# Patient Record
Sex: Female | Born: 1985 | Hispanic: Yes | Marital: Single | Smoking: Former smoker
Health system: Southern US, Community
[De-identification: ages and names within clinical notes are randomized; demographics above are authoritative.]

## PROBLEM LIST (undated history)

## (undated) DIAGNOSIS — F32A Depression, unspecified: Secondary | ICD-10-CM

## (undated) DIAGNOSIS — F329 Major depressive disorder, single episode, unspecified: Secondary | ICD-10-CM

## (undated) DIAGNOSIS — F209 Schizophrenia, unspecified: Secondary | ICD-10-CM

## (undated) DIAGNOSIS — F319 Bipolar disorder, unspecified: Secondary | ICD-10-CM

## (undated) DIAGNOSIS — R519 Headache, unspecified: Secondary | ICD-10-CM

## (undated) HISTORY — PX: NO PAST SURGERIES: SHX2092

---
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
## 2017-12-31 ENCOUNTER — Emergency Department (HOSPITAL_COMMUNITY)
Admission: EM | Admit: 2017-12-31 | Discharge: 2017-12-31 | Disposition: A | Discharge status: Home / Self Care | Payer: Medicaid Other | Attending: Emergency Medicine | Admitting: Emergency Medicine

## 2017-12-31 ENCOUNTER — Emergency Department (HOSPITAL_COMMUNITY): Payer: Medicaid Other

## 2017-12-31 ENCOUNTER — Other Ambulatory Visit: Payer: Self-pay

## 2017-12-31 DIAGNOSIS — R0600 Dyspnea, unspecified: Secondary | ICD-10-CM | POA: Insufficient documentation

## 2017-12-31 DIAGNOSIS — R0789 Other chest pain: Secondary | ICD-10-CM | POA: Diagnosis present

## 2017-12-31 DIAGNOSIS — R072 Precordial pain: Secondary | ICD-10-CM | POA: Insufficient documentation

## 2017-12-31 LAB — CBC WITH DIFFERENTIAL/PLATELET
Basophils Absolute: 0 10*3/uL (ref 0.0–0.1)
Basophils Relative: 0 %
Eosinophils Absolute: 0.3 10*3/uL (ref 0.0–0.7)
Eosinophils Relative: 4 %
HEMATOCRIT: 46.2 % — AB (ref 36.0–46.0)
Hemoglobin: 14.9 g/dL (ref 12.0–15.0)
LYMPHS ABS: 2.9 10*3/uL (ref 0.7–4.0)
LYMPHS PCT: 39 %
MCH: 30.3 pg (ref 26.0–34.0)
MCHC: 32.3 g/dL (ref 30.0–36.0)
MCV: 94.1 fL (ref 78.0–100.0)
Monocytes Absolute: 0.7 10*3/uL (ref 0.1–1.0)
Monocytes Relative: 10 %
Neutro Abs: 3.4 10*3/uL (ref 1.7–7.7)
Neutrophils Relative %: 47 %
PLATELETS: 369 10*3/uL (ref 150–400)
RBC: 4.91 MIL/uL (ref 3.87–5.11)
RDW: 12.9 % (ref 11.5–15.5)
WBC: 7.3 10*3/uL (ref 4.0–10.5)

## 2017-12-31 LAB — COMPREHENSIVE METABOLIC PANEL
ALBUMIN: 3.7 g/dL (ref 3.5–5.0)
ALT: 9 U/L (ref 0–44)
AST: 16 U/L (ref 15–41)
Alkaline Phosphatase: 57 U/L (ref 38–126)
Anion gap: 8 (ref 5–15)
BUN: 9 mg/dL (ref 6–20)
CHLORIDE: 107 mmol/L (ref 98–111)
CO2: 26 mmol/L (ref 22–32)
Calcium: 9 mg/dL (ref 8.9–10.3)
Creatinine, Ser: 0.55 mg/dL (ref 0.44–1.00)
GFR calc Af Amer: >60 mL/min (ref >60)
GLUCOSE: 101 mg/dL — AB (ref 70–99)
POTASSIUM: 3.9 mmol/L (ref 3.5–5.1)
SODIUM: 141 mmol/L (ref 135–145)
Total Bilirubin: 0.6 mg/dL (ref 0.3–1.2)
Total Protein: 6.5 g/dL (ref 6.5–8.1)

## 2017-12-31 LAB — URINALYSIS, ROUTINE W REFLEX MICROSCOPIC
Bacteria, UA: NONE SEEN
Bilirubin Urine: NEGATIVE
Glucose, UA: NEGATIVE mg/dL
Hgb urine dipstick: NEGATIVE
Ketones, ur: NEGATIVE mg/dL
Leukocytes, UA: NEGATIVE
Nitrite: NEGATIVE
PH: 6 (ref 5.0–8.0)
Protein, ur: NEGATIVE mg/dL
SPECIFIC GRAVITY, URINE: 1.017 (ref 1.005–1.030)

## 2017-12-31 LAB — I-STAT BETA HCG BLOOD, ED (MC, WL, AP ONLY)

## 2017-12-31 LAB — I-STAT TROPONIN, ED: Troponin i, poc: 0 ng/mL (ref 0.00–0.08)

## 2017-12-31 LAB — D-DIMER, QUANTITATIVE: D-Dimer, Quant: 0.29 ug/mL-FEU (ref 0.00–0.50)

## 2017-12-31 MED ORDER — KETOROLAC TROMETHAMINE 30 MG/ML IJ SOLN
30.0000 mg | Freq: Once | INTRAMUSCULAR | Status: AC
  Administered 2017-12-31: 30 mg via INTRAVENOUS
  Filled 2017-12-31: qty 1

## 2017-12-31 MED ORDER — ALBUTEROL SULFATE (2.5 MG/3ML) 0.083% IN NEBU
2.5000 mg | INHALATION_SOLUTION | Freq: Once | RESPIRATORY_TRACT | Status: AC
  Administered 2017-12-31: 2.5 mg via RESPIRATORY_TRACT
  Filled 2017-12-31: qty 3

## 2017-12-31 MED ORDER — IBUPROFEN 800 MG PO TABS: 800.0000 mg | ORAL_TABLET | Freq: Three times a day (TID) | ORAL | 0 refills | Status: DC | PRN

## 2017-12-31 MED ORDER — SODIUM CHLORIDE 0.9 % IV BOLUS
500.0000 mL | Freq: Once | INTRAVENOUS | Status: AC
  Administered 2017-12-31: 500 mL via INTRAVENOUS

## 2017-12-31 NOTE — Discharge Instructions (Signed)

## 2017-12-31 NOTE — ED Triage Notes (Signed)
She c/o shortness of breath today; plus chest pain, which she has had before. B.b.s. Clear per auscultation.

## 2017-12-31 NOTE — ED Provider Notes (Signed)
Emergency Department Provider Note   I have reviewed the triage vital signs and the nursing notes.   HISTORY  Chief Complaint Chest Pain   HPI Meagan Schmidt is a 32 y.o. female with PMH of intermittent chest pain presents to the emergency department for evaluation of central chest pain/tightness with shortness of breath.  Patient states that she awoke this morning with chest pain and that this episode feels similar to prior episodes of chest pain/shortness of breath that she has had in the past.  She states when she goes to the emergency department she typically gets IV fluids and a breathing treatment.  She does not have a history of asthma.  She states as of yet no cause for her symptoms has been identified.  She denies any leg swelling.  No radiation of symptoms.  Symptoms improved slightly with laying flat.  Denies any fevers, productive cough, hemoptysis.  No history of DVT/PE.  No past medical history on file.  There are no active problems to display for this patient.   Allergies Patient has no known allergies.  No family history on file.  Social History Social History   Tobacco Use  . Smoking status: Not on file  Substance Use Topics  . Alcohol use: Not on file  . Drug use: Not on file    Review of Systems  Constitutional: No fever/chills Eyes: No visual changes. ENT: No sore throat. Cardiovascular: Positive chest pain. Respiratory: Positive shortness of breath. Gastrointestinal: No abdominal pain.  No nausea, no vomiting.  No diarrhea.  No constipation. Genitourinary: Negative for dysuria. Musculoskeletal: Negative for back pain. Skin: Negative for rash. Neurological: Negative for headaches, focal weakness or numbness.  10-point ROS otherwise negative.  ____________________________________________   PHYSICAL EXAM:  VITAL SIGNS: ED Triage Vitals  Enc Vitals Group     BP 12/31/17 1050 (!) 116/91     Pulse Rate 12/31/17 1050 71       Resp 12/31/17 1050 20     Temp 12/31/17 1050 98.3 F (36.8 C)     Temp Source 12/31/17 1050 Oral     SpO2 12/31/17 1050 100 %     Pain Score 12/31/17 1052 7   Constitutional: Alert and oriented. Well appearing and in no acute distress. Eyes: Conjunctivae are normal. Head: Atraumatic. Nose: No congestion/rhinnorhea. Mouth/Throat: Mucous membranes are moist.  Oropharynx non-erythematous. Neck: No stridor.  Cardiovascular: Normal rate, regular rhythm. Good peripheral circulation. Grossly normal heart sounds.   Respiratory: Normal respiratory effort.  No retractions. Lungs CTAB. Gastrointestinal: Soft and nontender. No distention.  Musculoskeletal: No lower extremity tenderness nor edema. No gross deformities of extremities. Neurologic:  Normal speech and language. No gross focal neurologic deficits are appreciated.  Skin:  Skin is warm, dry and intact. No rash noted.  ____________________________________________   LABS (all labs ordered are listed, but only abnormal results are displayed)  Labs Reviewed  COMPREHENSIVE METABOLIC PANEL - Abnormal; Notable for the following components:      Result Value   Glucose, Bld 101 (*)    All other components within normal limits  CBC WITH DIFFERENTIAL/PLATELET - Abnormal; Notable for the following components:   HCT 46.2 (*)    All other components within normal limits  D-DIMER, QUANTITATIVE (NOT AT [facility])  URINALYSIS, ROUTINE W REFLEX MICROSCOPIC  I-STAT TROPONIN, ED  I-STAT BETA HCG BLOOD, ED ([facility], [facility], [facility] ONLY)   ____________________________________________  EKG   EKG Interpretation  Date/Time:  Saturday December 31 2017 11:04:47 EDT  Ventricular Rate:  57 PR Interval:    QRS Duration: 88 QT Interval:  452 QTC Calculation: 441 R Axis:   73 Text Interpretation:  Sinus rhythm No STEMI.  Confirmed by Alona BeneLong, Larence Thone [phone]([id number]) on 12/31/2017 11:13:21 AM Also confirmed by Alona BeneLong, Tameika Heckmann [phone]), editor Josephine IgoBelcher, Jessica ([phone])  on 12/31/2017  12:55:21 PM       ____________________________________________  RADIOLOGY  Dg Chest 2 View  Result Date: 12/31/2017 CLINICAL DATA:  Shortness of breath and chest pain EXAM: CHEST - 2 VIEW COMPARISON:  None. FINDINGS: Lungs are clear. Heart size and pulmonary vascularity are normal. No adenopathy. No pneumothorax. No bone lesions. IMPRESSION: No edema or consolidation. Electronically Signed   By: Bretta BangWilliam  Woodruff III M.D.   On: 12/31/2017 11:54    ____________________________________________   PROCEDURES  Procedure(s) performed:   Procedures  None ____________________________________________   INITIAL IMPRESSION / ASSESSMENT AND PLAN / ED COURSE  Pertinent labs & imaging results that were available during my care of the patient were reviewed by me and considered in my medical decision making (see chart for details).  Patient presents to the emergency department for evaluation of chest pain and shortness of breath which began after waking this morning.  She reports similar episodes in the past.  Vital signs are normal.  Her exam is largely unremarkable.  I do not appreciate any wheezing but she states breathing treatments have helped her in the past so we will give albuterol neb, IV fluids, and reassess after labs.  Will obtain d-dimer given history of chest pain shortness of breath which was sudden onset but patient is otherwise low risk.   CXR and labs reviewed. No acute findings. UA negative. Patient mentions some urinary frequency which prompted testing. CP resolved. Plan for close PCP follow up. [place] primarily lives in [place]. She is here for work. I provided contact for local PCP to establish care but [facility] if unable to establish care here.   At this time, I do not feel there is any life-threatening condition present. I have reviewed and discussed all results (EKG, imaging, lab, urine as appropriate), exam findings with patient. I have  reviewed nursing notes and appropriate previous records.  I feel the patient is safe to be discharged home without further emergent workup. Discussed usual and customary return precautions. Patient and family (if present) verbalize understanding and are comfortable with this plan.  Patient will follow-up with their primary care provider. If they do not have a primary care provider, information for follow-up has been provided to them. All questions have been answered.  ____________________________________________  FINAL CLINICAL IMPRESSION(S) / ED DIAGNOSES  Final diagnoses:  Precordial chest pain  Dyspnea, unspecified type     MEDICATIONS GIVEN DURING THIS VISIT:  Medications  sodium chloride 0.9 % bolus 500 mL (0 mLs Intravenous Stopped 12/31/17 1235)  ketorolac (TORADOL) 30 MG/ML injection 30 mg (30 mg Intravenous Given 12/31/17 1111)  albuterol (PROVENTIL) (2.5 MG/3ML) 0.083% nebulizer solution 2.5 mg (2.5 mg Nebulization Given 12/31/17 1111)     NEW OUTPATIENT MEDICATIONS STARTED DURING THIS VISIT:  Discharge Medication List as of 12/31/2017 12:34 PM    START taking these medications   Details  ibuprofen (ADVIL,MOTRIN) 800 MG tablet Take 1 tablet (800 mg total) by mouth every 8 (eight) hours as needed for moderate pain., Starting Sat 12/31/2017, Print        Note:  This document was prepared using Dragon voice recognition software and may  include unintentional dictation errors.  Alona Bene, MD Emergency Medicine    Sandeep Radell, Arlyss Repress, MD 12/31/17 [phone]

## 2018-10-30 ENCOUNTER — Emergency Department (HOSPITAL_COMMUNITY): Payer: Medicaid Other

## 2018-10-30 ENCOUNTER — Other Ambulatory Visit: Payer: Self-pay

## 2018-10-30 ENCOUNTER — Encounter (HOSPITAL_COMMUNITY): Payer: Self-pay | Admitting: Emergency Medicine

## 2018-10-30 ENCOUNTER — Emergency Department (HOSPITAL_COMMUNITY)
Admission: EM | Admit: 2018-10-30 | Discharge: 2018-10-30 | Disposition: A | Discharge status: Home / Self Care | Payer: Medicaid Other | Attending: Emergency Medicine | Admitting: Emergency Medicine

## 2018-10-30 DIAGNOSIS — Z3A01 Less than 8 weeks gestation of pregnancy: Secondary | ICD-10-CM | POA: Insufficient documentation

## 2018-10-30 DIAGNOSIS — O219 Vomiting of pregnancy, unspecified: Secondary | ICD-10-CM | POA: Insufficient documentation

## 2018-10-30 DIAGNOSIS — R1011 Right upper quadrant pain: Secondary | ICD-10-CM

## 2018-10-30 DIAGNOSIS — O99331 Smoking (tobacco) complicating pregnancy, first trimester: Secondary | ICD-10-CM | POA: Diagnosis not present

## 2018-10-30 DIAGNOSIS — O99611 Diseases of the digestive system complicating pregnancy, first trimester: Secondary | ICD-10-CM | POA: Diagnosis not present

## 2018-10-30 DIAGNOSIS — K209 Esophagitis, unspecified without bleeding: Secondary | ICD-10-CM

## 2018-10-30 DIAGNOSIS — R52 Pain, unspecified: Secondary | ICD-10-CM

## 2018-10-30 DIAGNOSIS — F17210 Nicotine dependence, cigarettes, uncomplicated: Secondary | ICD-10-CM | POA: Diagnosis not present

## 2018-10-30 DIAGNOSIS — K219 Gastro-esophageal reflux disease without esophagitis: Secondary | ICD-10-CM | POA: Diagnosis not present

## 2018-10-30 LAB — COMPREHENSIVE METABOLIC PANEL
ALT: 16 U/L (ref 0–44)
AST: 21 U/L (ref 15–41)
Albumin: 4.5 g/dL (ref 3.5–5.0)
Alkaline Phosphatase: 55 U/L (ref 38–126)
Anion gap: 11 (ref 5–15)
BUN: 13 mg/dL (ref 6–20)
CO2: 21 mmol/L — ABNORMAL LOW (ref 22–32)
Calcium: 9 mg/dL (ref 8.9–10.3)
Chloride: 103 mmol/L (ref 98–111)
Creatinine, Ser: 0.66 mg/dL (ref 0.44–1.00)
GFR calc Af Amer: >60 mL/min (ref >60)
GFR calc non Af Amer: >60 mL/min (ref >60)
Glucose, Bld: 93 mg/dL (ref 70–99)
Potassium: 3.3 mmol/L — ABNORMAL LOW (ref 3.5–5.1)
Sodium: 135 mmol/L (ref 135–145)
Total Bilirubin: 1.1 mg/dL (ref 0.3–1.2)
Total Protein: 8.1 g/dL (ref 6.5–8.1)

## 2018-10-30 LAB — URINALYSIS, ROUTINE W REFLEX MICROSCOPIC
Bacteria, UA: NONE SEEN
Bilirubin Urine: NEGATIVE
Glucose, UA: NEGATIVE mg/dL
Hgb urine dipstick: NEGATIVE
Ketones, ur: 80 mg/dL — AB
Leukocytes,Ua: NEGATIVE
Nitrite: NEGATIVE
Protein, ur: 30 mg/dL — AB
Specific Gravity, Urine: 1.04 — ABNORMAL HIGH (ref 1.005–1.030)
pH: 5 (ref 5.0–8.0)

## 2018-10-30 LAB — CBC
HCT: 49.8 % — ABNORMAL HIGH (ref 36.0–46.0)
Hemoglobin: 16.3 g/dL — ABNORMAL HIGH (ref 12.0–15.0)
MCH: 30.6 pg (ref 26.0–34.0)
MCHC: 32.7 g/dL (ref 30.0–36.0)
MCV: 93.4 fL (ref 80.0–100.0)
Platelets: 350 10*3/uL (ref 150–400)
RBC: 5.33 MIL/uL — ABNORMAL HIGH (ref 3.87–5.11)
RDW: 13.1 % (ref 11.5–15.5)
WBC: 11 10*3/uL — ABNORMAL HIGH (ref 4.0–10.5)
nRBC: 0 % (ref 0.0–0.2)

## 2018-10-30 LAB — I-STAT BETA HCG BLOOD, ED (MC, WL, AP ONLY): I-stat hCG, quantitative: >2000 m[IU]/mL — ABNORMAL HIGH (ref <5)

## 2018-10-30 LAB — HCG, QUANTITATIVE, PREGNANCY: hCG, Beta Chain, Quant, S: 10422 m[IU]/mL — ABNORMAL HIGH (ref <5)

## 2018-10-30 LAB — TROPONIN I: Troponin I: <0.03 ng/mL (ref <0.03)

## 2018-10-30 LAB — LIPASE, BLOOD: Lipase: 19 U/L (ref 11–51)

## 2018-10-30 MED ORDER — POTASSIUM CHLORIDE CRYS ER 20 MEQ PO TBCR
40.0000 meq | EXTENDED_RELEASE_TABLET | Freq: Once | ORAL | Status: AC
  Administered 2018-10-30: 40 meq via ORAL
  Filled 2018-10-30: qty 2

## 2018-10-30 MED ORDER — DOXYLAMINE-PYRIDOXINE 10-10 MG PO TBEC: 1.0000 | DELAYED_RELEASE_TABLET | Freq: Three times a day (TID) | ORAL | 0 refills | Status: DC | PRN

## 2018-10-30 MED ORDER — FAMOTIDINE 20 MG PO TABS: 20.0000 mg | ORAL_TABLET | Freq: Every day | ORAL | 0 refills | Status: DC

## 2018-10-30 MED ORDER — SODIUM CHLORIDE 0.9 % IV BOLUS
1000.0000 mL | Freq: Once | INTRAVENOUS | Status: AC
  Administered 2018-10-30: 14:00:00 1000 mL via INTRAVENOUS

## 2018-10-30 MED ORDER — ALUM & MAG HYDROXIDE-SIMETH 200-200-20 MG/5ML PO SUSP
30.0000 mL | Freq: Once | ORAL | Status: AC
  Administered 2018-10-30: 30 mL via ORAL
  Filled 2018-10-30: qty 30

## 2018-10-30 MED ORDER — METOCLOPRAMIDE HCL 5 MG/ML IJ SOLN
10.0000 mg | Freq: Once | INTRAMUSCULAR | Status: AC
  Administered 2018-10-30: 10 mg via INTRAVENOUS
  Filled 2018-10-30: qty 2

## 2018-10-30 MED ORDER — CALCIUM CARBONATE ANTACID 500 MG PO CHEW: 1.0000 | CHEWABLE_TABLET | Freq: Three times a day (TID) | ORAL | 0 refills | Status: DC | PRN

## 2018-10-30 MED ORDER — DIPHENHYDRAMINE HCL 50 MG/ML IJ SOLN
25.0000 mg | Freq: Once | INTRAMUSCULAR | Status: AC
  Administered 2018-10-30: 25 mg via INTRAVENOUS
  Filled 2018-10-30: qty 1

## 2018-10-30 NOTE — ED Provider Notes (Signed)
Patient care assumed at 1500. Patient here for evaluation of nausea, vomiting, chest discomfort consistent with reflux. She was found to be pregnant. Pelvic ultrasound demonstrates early pregnancy. Patient without recurrent vomiting in department. UA is not consistent with UTI. Plan to discharge with outpatient follow-up and return precautions.   Tilden Fossa, MD 10/30/18 [phone]

## 2018-10-30 NOTE — ED Triage Notes (Signed)
Pt c/o left sided chest pains since Thursday. Reports pain was so bad it wouldn't let her eat or drink. Reports went to [employer] on Thursday to get a tea to make chest pains better. Reports that she had a smoothie the other day and it didn't make her vomit. Pt reports had some diarrhea today. Also reports had two home pregnancy tests that are positive.

## 2018-10-30 NOTE — ED Triage Notes (Signed)
Pt adds that she been smoking cigarettes since age 33 and did smoke a lot the night before chest pains started.

## 2018-10-30 NOTE — ED Provider Notes (Signed)
[facility]-EMERGENCY DEPT Provider Note   CSN: [id number] Arrival date & time: 10/30/18  1235    History   Chief Complaint Chief Complaint  Patient presents with   Chest Pain   Diarrhea   Possible Pregnancy    HPI Meagan Schmidt is a 33 y.o. female.     HPI 33 year old female with past medical history of bicornuate uterus here with multiple complaints.  The patient states that she has had severe, burning, epigastric and substernal chest pain for the last several days.  This seems to be associated with nausea and vomiting, and she has temporary relief after vomiting.  She describes as burning sensation.  Is worse with lying flat.  She also had some mild pain in her right upper quadrant that radiates around towards her right back.  Denies any urinary symptoms.  Of note, she also had some vaginal spotting a week ago.  This was only 1 day of spotting and she took a pregnancy test which was positive.  She has repeated this and a second test was also positive at home.  She denies any fevers or chills.  She denies any vaginal bleeding or discharge.  She has 1 prior pregnancy that was delivered at 7 months due to her bicornate uterus and intrauterine growth restriction.  Denies history of miscarriages.  No other complaints.  History reviewed. No pertinent past medical history.  There are no active problems to display for this patient.   History reviewed. No pertinent surgical history.   OB History    Gravida  1   Para      Term      Preterm      AB      Living        SAB      TAB      Ectopic      Multiple      Live Births               Home Medications    Prior to Admission medications   Medication Sig Start Date End Date Taking? Authorizing Provider  calcium carbonate (TUMS) 500 MG chewable tablet Chew 1 tablet (200 mg of elemental calcium total) by mouth 3 (three) times daily as needed for indigestion or heartburn.  10/30/18   Shaune Pollack, MD  Doxylamine-Pyridoxine 10-10 MG TBEC Take 1 tablet by mouth 3 (three) times daily as needed (nausea, vomiting). 10/30/18   Shaune Pollack, MD  famotidine (PEPCID) 20 MG tablet Take 1 tablet (20 mg total) by mouth daily for 7 days. 10/30/18 11/06/18  Shaune Pollack, MD  ibuprofen (ADVIL,MOTRIN) 800 MG tablet Take 1 tablet (800 mg total) by mouth every 8 (eight) hours as needed for moderate pain. Patient not taking: Reported on 10/30/2018 12/31/17   Long, Arlyss Repress, MD    Family History No family history on file.  Social History Social History   Tobacco Use   Smoking status: Current Every Day Smoker    Types: Cigarettes   Smokeless tobacco: Never Used  Substance Use Topics   Alcohol use: Not on file   Drug use: Not on file     Allergies   Patient has no known allergies.   Review of Systems Review of Systems  Constitutional: Positive for fatigue. Negative for chills and fever.  HENT: Negative for congestion, rhinorrhea and sore throat.   Eyes: Negative for visual disturbance.  Respiratory: Negative for cough, shortness of breath and wheezing.   Cardiovascular:  Negative for chest pain and leg swelling.  Gastrointestinal: Positive for abdominal pain, nausea and vomiting. Negative for diarrhea.  Genitourinary: Negative for dysuria, flank pain, vaginal bleeding and vaginal discharge.  Musculoskeletal: Negative for neck pain.  Skin: Negative for rash.  Allergic/Immunologic: Negative for immunocompromised state.  Neurological: Negative for syncope and headaches.  Hematological: Does not bruise/bleed easily.  All other systems reviewed and are negative.    Physical Exam Updated Vital Signs BP 106/64 (BP Location: Right Arm)    Pulse 77    Temp 98.5 F (36.9 C) (Oral)    Resp 16    Ht 5\' 1"  (1.549 m)    Wt 59 kg    LMP 12/26/2017 (Exact Date) Comment: pt is currently pregnant 05.04.2020   SpO2 100%    BMI 24.56 kg/m   Physical Exam Vitals signs and  nursing note reviewed.  Constitutional:      General: She is not in acute distress.    Appearance: She is well-developed.  HENT:     Head: Normocephalic and atraumatic.     Mouth/Throat:     Mouth: Mucous membranes are dry.  Eyes:     Conjunctiva/sclera: Conjunctivae normal.  Neck:     Musculoskeletal: Neck supple.  Cardiovascular:     Rate and Rhythm: Normal rate and regular rhythm.     Heart sounds: Normal heart sounds. No murmur. No friction rub.     Comments: Mild TTP over anterior chest wall Pulmonary:     Effort: Pulmonary effort is normal. No respiratory distress.     Breath sounds: Normal breath sounds. No wheezing or rales.  Abdominal:     General: Abdomen is flat. There is no distension.     Palpations: Abdomen is soft.     Tenderness: There is abdominal tenderness (mild, epigastric).  Skin:    General: Skin is warm.     Capillary Refill: Capillary refill takes less than 2 seconds.  Neurological:     Mental Status: She is alert and oriented to person, place, and time.     Motor: No abnormal muscle tone.      ED Treatments / Results  Labs (all labs ordered are listed, but only abnormal results are displayed) Labs Reviewed  CBC - Abnormal; Notable for the following components:      Result Value   WBC 11.0 (*)    RBC 5.33 (*)    Hemoglobin 16.3 (*)    HCT 49.8 (*)    All other components within normal limits  COMPREHENSIVE METABOLIC PANEL - Abnormal; Notable for the following components:   Potassium 3.3 (*)    CO2 21 (*)    All other components within normal limits  HCG, QUANTITATIVE, PREGNANCY - Abnormal; Notable for the following components:   hCG, Beta Chain, Quant, S 10,422 (*)    All other components within normal limits  URINALYSIS, ROUTINE W REFLEX MICROSCOPIC - Abnormal; Notable for the following components:   Color, Urine AMBER (*)    APPearance HAZY (*)    Specific Gravity, Urine 1.040 (*)    Ketones, ur 80 (*)    Protein, ur 30 (*)    All  other components within normal limits  I-STAT BETA HCG BLOOD, ED ([facility], [facility], [facility] ONLY) - Abnormal; Notable for the following components:   I-stat hCG, quantitative >2,000.0 (*)    All other components within normal limits  TROPONIN I  LIPASE, BLOOD    EKG EKG Interpretation  Date/Time:  Monday Oct 30 2018 12:48:00 EDT Ventricular Rate:  75 PR Interval:    QRS Duration: 80 QT Interval:  393 QTC Calculation: 439 R Axis:   82 Text Interpretation:  Sinus rhythm Consider right atrial enlargement No significant change since last tracing Confirmed by Shaune Pollack [phone]) on 10/30/2018 1:24:46 PM   Radiology Dg Chest 1 View  Result Date: 10/30/2018 CLINICAL DATA:  Left-sided chest pain. EXAM: CHEST  1 VIEW COMPARISON:  Chest x-ray dated December 31, 2017. FINDINGS: The heart size and mediastinal contours are within normal limits. Both lungs are clear. The visualized skeletal structures are unremarkable. IMPRESSION: No active disease. Electronically Signed   By: Obie Dredge M.D.   On: 10/30/2018 15:05   US Ob Comp Less 14 Wks  Result Date: 10/30/2018 CLINICAL DATA:  Right upper quadrant pain. Patient with quantitative beta HCG greater than 2000. Evaluate for ectopic pregnancy. EXAM: OBSTETRIC <14 WK ULTRASOUND TECHNIQUE: Transabdominal ultrasound was performed for evaluation of the gestation as well as the maternal uterus and adnexal regions. Patient declined transvaginal imaging. COMPARISON:  None. FINDINGS: Intrauterine gestational sac: Single visualized. Yolk sac:  Not visualized. Embryo:  Not visualized. Cardiac Activity: Not visualized. Heart Rate: Not visualized.  Bpm MSD:  6.8 mm   5 w   3 d Subchorionic hemorrhage:  None visualized. Maternal uterus/adnexae: Suggestion of split versus 2 separate endometrium which may be due to didelphys versus arcuate uterus. Ovaries normal size, shape and position with normal color Doppler. IMPRESSION: Intrauterine gestational sac without yolk sac or embryo.  Mean sac diameter compatible with estimated gestational age 102 weeks 3 days. Findings likely represent a normal early pregnancy and less likely anembryonic pregnancy. Recommend correlation with serial quantitative beta HCG and follow-up ultrasound 2 weeks. Electronically Signed   By: Elberta Fortis M.D.   On: 10/30/2018 15:23   US Abdomen Limited Ruq  Result Date: 10/30/2018 CLINICAL DATA:  Right upper quadrant pain, nausea, vomiting and diarrhea for approximately 2 days. EXAM: ULTRASOUND ABDOMEN LIMITED RIGHT UPPER QUADRANT COMPARISON:  None. FINDINGS: Gallbladder: No gallstones or wall thickening visualized. No sonographic Murphy sign noted by sonographer. Common bile duct: Diameter: 0.4 cm Liver: No focal lesion identified. Within normal limits in parenchymal echogenicity. Portal vein is patent on color Doppler imaging with normal direction of blood flow towards the liver. IMPRESSION: Normal exam.  Negative for gallstones. Electronically Signed   By: Drusilla Kanner M.D.   On: 10/30/2018 14:43    Procedures Procedures (including critical care time)  Medications Ordered in ED Medications  metoCLOPramide (REGLAN) injection 10 mg (10 mg Intravenous Given 10/30/18 1400)  diphenhydrAMINE (BENADRYL) injection 25 mg (25 mg Intravenous Given 10/30/18 1400)  sodium chloride 0.9 % bolus 1,000 mL (0 mLs Intravenous Stopped 10/30/18 1703)  alum & mag hydroxide-simeth (MAALOX/MYLANTA) 200-200-20 MG/5ML suspension 30 mL (30 mLs Oral Given 10/30/18 1401)  sodium chloride 0.9 % bolus 1,000 mL (0 mLs Intravenous Stopped 10/30/18 1704)  potassium chloride SA (K-DUR) CR tablet 40 mEq (40 mEq Oral Given 10/30/18 1539)     Initial Impression / Assessment and Plan / ED Course  I have reviewed the triage vital signs and the nursing notes.  Pertinent labs & imaging results that were available during my care of the patient were reviewed by me and considered in my medical decision making (see chart for details).        34  yo F here with multiple complaints.  I suspect most of her sx are from GERD/gastritis and esophagitis 2/2 vomiting  from pregnancy. EKG non-ischemic, trop neg - doubt ACS. No tachypnea, SOB, cough, or signs of PE clinically. Her sx correlate directly w/ vomiting and she has no LE TTP or swelling. Will give antacids, fluids. Will check U/S to eval for ectopic pregnancy, free fluid, as well as gallstones given abdominal as well as RUQ/Epigastric TTP.   U/S shows gestational sac, likely normal early pregnancy. However, anembryonic pregnancy also a consideration. Will have her repeat quant in [facility] hr [facility] Otherwise, labs, imaging reassuring and c/w dehydration but otherwise unremarkable. She has no vaginal bleeding or pain. Will f/u UA, continue fluids, plan for d/c with antiemetics, PNV, and OB f/u. Pt expressed interest in possible termination - will [facility] her [facility]. She was encouraged to do so ASAP to prevent higher risk procedure/interventions.  Final Clinical Impressions(s) / ED Diagnoses   Final diagnoses:  RUQ pain  Nausea/vomiting in pregnancy  Esophagitis    ED Discharge Orders         Ordered    famotidine (PEPCID) 20 MG tablet  Daily     10/30/18 1513    calcium carbonate (TUMS) 500 MG chewable tablet  3 times daily PRN     10/30/18 1513    Doxylamine-Pyridoxine 10-10 MG TBEC  3 times daily PRN     10/30/18 1513           Shaune Pollack, MD 10/30/18 1926

## 2018-11-05 ENCOUNTER — Emergency Department (HOSPITAL_COMMUNITY)
Admission: EM | Admit: 2018-11-05 | Discharge: 2018-11-05 | Disposition: A | Discharge status: Home / Self Care | Payer: Medicaid Other | Source: Home / Self Care | Attending: Emergency Medicine | Admitting: Emergency Medicine

## 2018-11-05 ENCOUNTER — Encounter (HOSPITAL_COMMUNITY): Payer: Self-pay

## 2018-11-05 ENCOUNTER — Other Ambulatory Visit: Payer: Self-pay

## 2018-11-05 ENCOUNTER — Observation Stay (HOSPITAL_COMMUNITY)
Admission: EM | Admit: 2018-11-05 | Discharge: 2018-11-07 | Disposition: A | Discharge status: Home / Self Care | Payer: Medicaid Other | Attending: Internal Medicine | Admitting: Internal Medicine

## 2018-11-05 DIAGNOSIS — Z3490 Encounter for supervision of normal pregnancy, unspecified, unspecified trimester: Secondary | ICD-10-CM

## 2018-11-05 DIAGNOSIS — I959 Hypotension, unspecified: Secondary | ICD-10-CM | POA: Diagnosis present

## 2018-11-05 DIAGNOSIS — R0789 Other chest pain: Secondary | ICD-10-CM | POA: Insufficient documentation

## 2018-11-05 DIAGNOSIS — O99611 Diseases of the digestive system complicating pregnancy, first trimester: Secondary | ICD-10-CM | POA: Diagnosis not present

## 2018-11-05 DIAGNOSIS — O26891 Other specified pregnancy related conditions, first trimester: Secondary | ICD-10-CM | POA: Diagnosis not present

## 2018-11-05 DIAGNOSIS — O219 Vomiting of pregnancy, unspecified: Secondary | ICD-10-CM | POA: Diagnosis present

## 2018-11-05 DIAGNOSIS — O99331 Smoking (tobacco) complicating pregnancy, first trimester: Secondary | ICD-10-CM | POA: Insufficient documentation

## 2018-11-05 DIAGNOSIS — O99111 Other diseases of the blood and blood-forming organs and certain disorders involving the immune mechanism complicating pregnancy, first trimester: Secondary | ICD-10-CM | POA: Diagnosis not present

## 2018-11-05 DIAGNOSIS — R9431 Abnormal electrocardiogram [ECG] [EKG]: Secondary | ICD-10-CM | POA: Diagnosis not present

## 2018-11-05 DIAGNOSIS — O210 Mild hyperemesis gravidarum: Secondary | ICD-10-CM | POA: Diagnosis not present

## 2018-11-05 DIAGNOSIS — R112 Nausea with vomiting, unspecified: Secondary | ICD-10-CM | POA: Diagnosis present

## 2018-11-05 DIAGNOSIS — K219 Gastro-esophageal reflux disease without esophagitis: Secondary | ICD-10-CM | POA: Diagnosis not present

## 2018-11-05 DIAGNOSIS — Z1159 Encounter for screening for other viral diseases: Secondary | ICD-10-CM | POA: Diagnosis not present

## 2018-11-05 DIAGNOSIS — D72829 Elevated white blood cell count, unspecified: Secondary | ICD-10-CM | POA: Diagnosis present

## 2018-11-05 DIAGNOSIS — Z3A01 Less than 8 weeks gestation of pregnancy: Secondary | ICD-10-CM | POA: Insufficient documentation

## 2018-11-05 DIAGNOSIS — R079 Chest pain, unspecified: Secondary | ICD-10-CM | POA: Diagnosis present

## 2018-11-05 DIAGNOSIS — F17210 Nicotine dependence, cigarettes, uncomplicated: Secondary | ICD-10-CM | POA: Insufficient documentation

## 2018-11-05 MED ORDER — FAMOTIDINE IN NACL 20-0.9 MG/50ML-% IV SOLN
20.0000 mg | Freq: Once | INTRAVENOUS | Status: AC
  Administered 2018-11-06: 20 mg via INTRAVENOUS
  Filled 2018-11-05: qty 50

## 2018-11-05 MED ORDER — SODIUM CHLORIDE 0.9 % IV BOLUS (SEPSIS)
1000.0000 mL | Freq: Once | INTRAVENOUS | Status: AC
  Administered 2018-11-06: 1000 mL via INTRAVENOUS

## 2018-11-05 MED ORDER — ALUM & MAG HYDROXIDE-SIMETH 200-200-20 MG/5ML PO SUSP
30.0000 mL | Freq: Once | ORAL | Status: AC
  Administered 2018-11-06: 02:00:00 30 mL via ORAL
  Filled 2018-11-05: qty 30

## 2018-11-05 MED ORDER — LIDOCAINE VISCOUS HCL 2 % MT SOLN
15.0000 mL | Freq: Once | OROMUCOSAL | Status: AC
  Administered 2018-11-06: 15 mL via ORAL
  Filled 2018-11-05: qty 15

## 2018-11-05 MED ORDER — METOCLOPRAMIDE HCL 5 MG/ML IJ SOLN
10.0000 mg | Freq: Once | INTRAMUSCULAR | Status: AC
  Administered 2018-11-06: 10 mg via INTRAVENOUS
  Filled 2018-11-05: qty 2

## 2018-11-05 MED ORDER — ACETAMINOPHEN 325 MG PO TABS
650.0000 mg | ORAL_TABLET | Freq: Once | ORAL | Status: AC
  Administered 2018-11-05: 650 mg via ORAL
  Filled 2018-11-05: qty 2

## 2018-11-05 NOTE — ED Notes (Signed)
Pt continues to stay balled up with covers over her,

## 2018-11-05 NOTE — ED Notes (Signed)
Pt has reproducible chest pain, stressed due to being pregnant and does not want the baby, states she was here 4 days ago and was told to to follow up at [facility], she stated she called and they said they were closed due to Covid, Pt tearful and balled in fetal position.

## 2018-11-05 NOTE — ED Provider Notes (Signed)
[facility]-EMERGENCY DEPT Provider Note   CSN: [id number] Arrival date & time: 11/05/18  1231    History   Chief Complaint Chief Complaint  Patient presents with  . Chest Pain    HPI Meagan Schmidt is a 33 y.o. female.     HPI   She presents for evaluation of chest pain.  She was in the emergency department, 6 days ago at that time with vomiting with chest and abdominal pain.  An abdominal ultrasound was done which was normal.  Pelvic ultrasound done showed 5-week uncomplicated pregnancy.  Today she is here for evaluation of chest discomfort, worse with movement.  She came here by public transportation.  She reports that she continues to have vomiting, as well as the chest discomfort which is now been there for 10 days.  The chest discomfort comes and goes, and is worse with both palpation and movement.  She denies fever, chills, shortness of breath, weakness or dizziness.  She did have some vaginal bleeding but it has resolved.  She denies abdominal pain at this time.  She is not using anything for the chest discomfort.  She states that she was given Tums for her abdominal pain and is using that.  She did not get the prescriptions which were given at the visit, 6 days ago.  There are no other known modifying factors.  History reviewed. No pertinent past medical history.  There are no active problems to display for this patient.   History reviewed. No pertinent surgical history.   OB History    Gravida  1   Para      Term      Preterm      AB      Living        SAB      TAB      Ectopic      Multiple      Live Births               Home Medications    Prior to Admission medications   Medication Sig Start Date End Date Taking? Authorizing Provider  calcium carbonate (TUMS) 500 MG chewable tablet Chew 1 tablet (200 mg of elemental calcium total) by mouth 3 (three) times daily as needed for indigestion or heartburn.  10/30/18   Shaune PollackIsaacs, Cameron, MD  Doxylamine-Pyridoxine 10-10 MG TBEC Take 1 tablet by mouth 3 (three) times daily as needed (nausea, vomiting). 10/30/18   Shaune PollackIsaacs, Cameron, MD  famotidine (PEPCID) 20 MG tablet Take 1 tablet (20 mg total) by mouth daily for 7 days. 10/30/18 11/06/18  Shaune PollackIsaacs, Cameron, MD  ibuprofen (ADVIL,MOTRIN) 800 MG tablet Take 1 tablet (800 mg total) by mouth every 8 (eight) hours as needed for moderate pain. Patient not taking: Reported on 10/30/2018 12/31/17   Long, Arlyss RepressJoshua G, MD    Family History No family history on file.  Social History Social History   Tobacco Use  . Smoking status: Current Every Day Smoker    Types: Cigarettes  . Smokeless tobacco: Never Used  Substance Use Topics  . Alcohol use: Not on file  . Drug use: Not on file     Allergies   Patient has no known allergies.   Review of Systems Review of Systems  All other systems reviewed and are negative.    Physical Exam Updated Vital Signs BP 127/78 (BP Location: Left Arm)   Pulse 74   Temp 98 F (36.7 C) (Oral)  Resp 18   Ht 5\' 1"  (1.549 m)   Wt 59 kg   SpO2 100%   BMI 24.58 kg/m   Physical Exam Vitals signs and nursing note reviewed.  Constitutional:      General: She is in acute distress (Tearful).     Appearance: She is well-developed. She is not ill-appearing, toxic-appearing or diaphoretic.  HENT:     Head: Normocephalic and atraumatic.     Right Ear: External ear normal.     Left Ear: External ear normal.  Eyes:     Conjunctiva/sclera: Conjunctivae normal.     Pupils: Pupils are equal, round, and reactive to light.  Neck:     Musculoskeletal: Normal range of motion and neck supple.     Trachea: Phonation normal.  Cardiovascular:     Rate and Rhythm: Normal rate and regular rhythm.     Heart sounds: Normal heart sounds.  Pulmonary:     Effort: Pulmonary effort is normal.     Breath sounds: Normal breath sounds.  Chest:     Chest wall: Tenderness (Exquisite diffuse left  anterior chest wall tenderness without crepitation or deformity.) present.  Abdominal:     General: There is no distension.     Palpations: Abdomen is soft. There is no mass.     Tenderness: There is no abdominal tenderness.     Hernia: No hernia is present.  Musculoskeletal: Normal range of motion.        General: No swelling or tenderness.     Right lower leg: No edema.     Left lower leg: No edema.  Skin:    General: Skin is warm and dry.  Neurological:     Mental Status: She is alert and oriented to person, place, and time.     Cranial Nerves: No cranial nerve deficit.     Sensory: No sensory deficit.     Motor: No abnormal muscle tone.     Coordination: Coordination normal.  Psychiatric:        Attention and Perception: She is inattentive.        Mood and Affect: Mood is anxious and depressed.        Speech: She is communicative. Speech is not rapid and pressured or slurred.        Behavior: Behavior is not agitated or slowed.        Thought Content: Thought content is not delusional. Thought content does not include suicidal plan.        Cognition and Memory: Memory is not impaired.        Judgment: Judgment is inappropriate. Judgment is not impulsive.      ED Treatments / Results  Labs (all labs ordered are listed, but only abnormal results are displayed) Labs Reviewed - No data to display  EKG EKG Interpretation  Date/Time:  Sunday Nov 05 2018 12:42:39 EDT Ventricular Rate:  63 PR Interval:    QRS Duration: 84 QT Interval:  450 QTC Calculation: 461 R Axis:   87 Text Interpretation:  Sinus rhythm since last tracing no significant change Confirmed by Mancel Bale [phone]) on 11/05/2018 12:48:01 PM   Radiology No results found.  Procedures Procedures (including critical care time)  Medications Ordered in ED Medications  acetaminophen (TYLENOL) tablet 650 mg (650 mg Oral Given 11/05/18 1311)     Initial Impression / Assessment and Plan / ED Course  I  have reviewed the triage vital signs and the nursing notes.  Pertinent labs & imaging  results that were available during my care of the patient were reviewed by me and considered in my medical decision making (see chart for details).         Patient Vitals for the past 24 hrs:  BP Temp Temp src Pulse Resp SpO2 Height Weight  11/05/18 1511 (!) 101/52 - - 61 18 98 % - -  11/05/18 1430 (!) 100/52 - - (!) 56 - 99 % - -  11/05/18 1330 (!) 117/51 - - (!) 57 18 99 % - -  11/05/18 1244 127/78 98 F (36.7 C) Oral 74 18 100 %  (1.549 m) 59 kg    3:14 PM Reevaluation with update and discussion. After initial assessment and treatment, an updated evaluation reveals after being treated with Tylenol she fell asleep and was comfortable.  At this time she is now awake, partially sitting up, and tearful.  She states that she wants "something for my insides."  I explained to her that there was no sign of anything serious or complications of the pregnancy at this time.  She was encouraged to use Tylenol for pain as well as heat to the affected area.  I attempted to answer all of her questions. Mancel Bale   Medical Decision Making: Chest wall pain, nonspecific, with likely anxiety.  She is pregnant and not sure which is the pregnancy.  She states that she lives with the baby's father.  Doubt pregnancy complications at this time.  Doubt unstable psychiatric condition.  CRITICAL CARE-no Performed by: Mancel Bale  Nursing Notes Reviewed/ Care Coordinated Applicable Imaging Reviewed Interpretation of Laboratory Data incorporated into ED treatment  The patient appears reasonably screened and/or stabilized for discharge and I doubt any other medical condition or other [facility] requiring further screening, evaluation, or treatment in the ED at this time prior to discharge.  Plan: Home Medications-Tylenol for pain; Home Treatments-heat to affected area; return here if the recommended treatment, does not  improve the symptoms; Recommended follow up-will be follow-up soon as possible for resolution of decision and planning for pregnancy care.    Final Clinical Impressions(s) / ED Diagnoses   Final diagnoses:  None    ED Discharge Orders    None       Mancel Bale, MD 11/05/18 [phone]

## 2018-11-05 NOTE — ED Notes (Signed)
Pt balled up crying, "I just hurt" Tylenol given as ordered

## 2018-11-05 NOTE — ED Triage Notes (Addendum)
Pt states that she is having left sided chest pain. Pt states that she was instructed to come back if it got worse. Pt denies smoking.  Pt states that the pain increased with movement. Pt states that she was covered in sweat after the taxi ride. Pt states she had an episode of emesis before coming to the hospital. Pt states she is pregnant.

## 2018-11-05 NOTE — ED Provider Notes (Addendum)
TIME SEEN: 11:08 PM  CHIEF COMPLAINT: Chest pain, vomiting  HPI: Patient is a 33 year old G2, P1 who is approximately 6 weeks and 1 day pregnant who presents to the emergency department with chest pain that has been ongoing since May 4.  Described as a sharp, burning pain throughout her chest but worse on the left side.  She states that she has had multiple episodes of nausea and vomiting and is now vomiting with streaks of blood.  She states "I do not have any money" and has not filled the prescription she was prescribed for her ED visits on May 4 or May 9.  She had a right upper quadrant ultrasound on May 4.  She also had a clear chest x-ray at that time.  She states that when she vomits she has chills and sweats.  No fevers.  No cough.  No diarrhea.  No vaginal bleeding or discharge.  No dysuria or hematuria.  Last had some spotting on May 4 and that is when she had a positive pregnancy test at home and in the ED.  ROS: See HPI Constitutional: no fever  Eyes: no drainage  ENT: no runny nose   Cardiovascular:   chest pain  Resp: no SOB  GI:  vomiting GU: no dysuria Integumentary: no rash  Allergy: no hives  Musculoskeletal: no leg swelling  Neurological: no slurred speech ROS otherwise negative  PAST MEDICAL HISTORY/PAST SURGICAL HISTORY:  History reviewed. No pertinent past medical history.  MEDICATIONS:  Prior to Admission medications   Medication Sig Start Date End Date Taking? Authorizing Provider  calcium carbonate (TUMS) 500 MG chewable tablet Chew 1 tablet (200 mg of elemental calcium total) by mouth 3 (three) times daily as needed for indigestion or heartburn. 10/30/18   Shaune Pollack, MD  Camphor-Eucalyptus-Menthol (VICKS VAPORUB) 4.7-1.2-2.6 % OINT Apply 1 application topically daily as needed (applied to chest for pain).    [provider]  Doxylamine-Pyridoxine 10-10 MG TBEC Take 1 tablet by mouth 3 (three) times daily as needed (nausea, vomiting). Patient not  taking: Reported on 11/05/2018 10/30/18   Shaune Pollack, MD  famotidine (PEPCID) 20 MG tablet Take 1 tablet (20 mg total) by mouth daily for 7 days. Patient not taking: Reported on 11/05/2018 10/30/18 11/06/18  Shaune Pollack, MD  ibuprofen (ADVIL,MOTRIN) 800 MG tablet Take 1 tablet (800 mg total) by mouth every 8 (eight) hours as needed for moderate pain. Patient not taking: Reported on 10/30/2018 12/31/17   Long, Arlyss Repress, MD    ALLERGIES:  No Known Allergies  SOCIAL HISTORY:  Social History   Tobacco Use  . Smoking status: Current Every Day Smoker    Types: Cigarettes  . Smokeless tobacco: Never Used  Substance Use Topics  . Alcohol use: Not on file    FAMILY HISTORY: History reviewed. No pertinent family history.  EXAM: BP 130/76   Pulse 86   Temp 97.6 F (36.4 C) (Oral)   Resp (!) 24   Ht 5\' 1"  (1.549 m)   Wt 59 kg   LMP 12/26/2017 (Exact Date) Comment: pt is currently pregnant 05.04.2020  SpO2 100%   BMI 24.58 kg/m  CONSTITUTIONAL: Alert and oriented and responds appropriately to questions.  Anxious, tearful HEAD: Normocephalic EYES: Conjunctivae clear, pupils appear equal, EOMI ENT: normal nose; moist mucous membranes NECK: Supple, no meningismus, no nuchal rigidity, no LAD  CARD: RRR; S1 and S2 appreciated; no murmurs, no clicks, no rubs, no gallops RESP: Normal chest excursion without splinting or  tachypnea; breath sounds clear and equal bilaterally; no wheezes, no rhonchi, no rales, no hypoxia or respiratory distress, speaking full sentences CHEST:  Chest wall is very tender to palpation specially over the left chest wall.  When I palpate her chest she grabs my hand to pull it away.  No crepitus, ecchymosis, erythema, warmth, rash or other lesions present.   ABD/GI: Normal bowel sounds; non-distended; soft, non-tender, no rebound, no guarding, no peritoneal signs, no hepatosplenomegaly BACK:  The back appears normal and is non-tender to palpation, there is no CVA  tenderness EXT: Normal ROM in all joints; non-tender to palpation; no edema; normal capillary refill; no cyanosis, no calf tenderness or swelling    SKIN: Normal color for age and race; warm; no rash NEURO: Moves all extremities equally PSYCH: The patient's mood and manner are appropriate. Grooming and personal hygiene are appropriate.  MEDICAL DECISION MAKING: Patient here with likely GERD, chest wall pain, Mallory-Weiss tears, hyperemesis gravidarum.  She did have lab work on May 4 that showed hemoconcentration and large ketones in her urine.  X-ray and right upper quadrant ultrasound that time were normal.  Transvaginal ultrasound showed intrauterine gestational sac without yolk sac or embryo measuring approximately 5 weeks and 3 days.  At that time they recommended she have her quant rechecked in 48 hours which she has not had done.  She denies any vaginal bleeding or discharge.  Her abdominal exam today is benign.  Will repeat labs today as well as urine.  I do not feel that she needs a repeat chest x-ray is of low suspicion for pneumothorax, pneumonia, edema.  I do not think that this is an esophageal rupture.  Doubt dissection, ACS or PE currently.  Pain is reproducible with palpation.  I suspect that the streaks of blood that she is seen her vomit are from Mallory-Weiss tears from recurrent vomiting for the past week and a half from hyperemesis gravidarum from pregnancy.  Will treat with IV fluids, Reglan, Pepcid, GI cocktail.  ED PROGRESS: 2:10 AM  Pt's labs show leukocytosis of 16,000 with left shift.  She has not had any fever.  Her abdominal exam today is benign.  No cough.  I do not feel she needs repeat chest x-ray.  LFTs, lipase normal.  Electrolytes, creatinine normal.  Patient reports nausea improved after Reglan.  She was vomiting in the ED prior to receiving antiemetics and did appear to do have streaks of blood.  No large-volume hematemesis.  She denies any melena.  Now that vomiting is  under control, will give GI cocktail.  She is receiving a second liter of IV fluids and will obtain urinalysis.  Patient states nausea has improved but still having chest pain.  Will reassess her chest pain after GI cocktail.  She is attempting to drink fluids at this time.   2:30 AM  Pt vomited after p.o. challenge.  Still complaining of pain.  Will give Tylenol and dose of IV Zofran.  If patient continues to vomit, she may need admission for intractable vomiting.  3:15 AM Pt reports she still feels uncomfortable and is having nausea.  She will attempt a fluid challenge after IV Zofran.  She states that she does not have an OB/GYN and is not interested in carrying this pregnancy to term.  She wants to terminate this pregnancy.  3:55 AM  Pt reports vomiting again after drinking a small amount of ginger ale.  She has received 2 antiemetics and immediately after any p.o.  intake patient begins vomiting.  I feel patient will need admission for intractable vomiting.  Will give dose of IV Phenergan.  I do not think that the patient needs to be admitted at this time to the [facility] service given this is not a viable pregnancy and she wants to terminate this pregnancy.  She still feels as if she is unable to urinate.  We will continue IV hydration.   4:41 AM Discussed patient's case with hospitalist, Dr. Loney Lohathore.  I have recommended admission and patient (and family if present) agree with this plan. Admitting physician will place admission orders.   Will admit for intractable vomiting, IV hydration, symptom control.  I reviewed all nursing notes, vitals, pertinent previous records, EKGs, lab and urine results, imaging (as available).   4:45 AM  Updated patient's boyfriend at [id number] with patient's permission.    EKG Interpretation  Date/Time:  Sunday Nov 05 2018 22:44:08 EDT Ventricular Rate:  94 PR Interval:    QRS Duration: 88 QT Interval:  389 QTC Calculation: 487 R Axis:   75 Text  Interpretation:  Sinus rhythm Borderline prolonged QT interval No significant change since last tracing Confirmed by Parry Po, Baxter HireKristen [phone]([id number]) on 11/05/2018 11:08:06 PM             Ahan Eisenberger, Layla MawKristen N, DO 11/06/18 [id number]

## 2018-11-05 NOTE — Discharge Instructions (Addendum)
Use Tylenol every 4 hours for pain.  Try using heat on the sore area 3 or 4 times a day.  Follow-up with your doctor, or the doctor of your choice as needed for problems.  It is important to see an obstetrician for pregnancy care, as you proceed with your pregnancy.

## 2018-11-05 NOTE — ED Triage Notes (Signed)
Pt arrives via taxi for eval of chest pain and dry heaves. Pt was seen and worked up at [facility] this afternoon. Pt is also 5-[redacted] weeks pregnant, states she went to [facility] for [facility]up "but it was closed". Pt is dry heaving w/ no actually emesis output. Tearful and rolling around in the bed, crying out periodically. Pain and discomfort is distractable.

## 2018-11-06 DIAGNOSIS — R112 Nausea with vomiting, unspecified: Secondary | ICD-10-CM | POA: Diagnosis not present

## 2018-11-06 DIAGNOSIS — Z3490 Encounter for supervision of normal pregnancy, unspecified, unspecified trimester: Secondary | ICD-10-CM

## 2018-11-06 DIAGNOSIS — D72829 Elevated white blood cell count, unspecified: Secondary | ICD-10-CM | POA: Diagnosis present

## 2018-11-06 DIAGNOSIS — I959 Hypotension, unspecified: Secondary | ICD-10-CM | POA: Diagnosis present

## 2018-11-06 DIAGNOSIS — O210 Mild hyperemesis gravidarum: Secondary | ICD-10-CM | POA: Diagnosis present

## 2018-11-06 DIAGNOSIS — R079 Chest pain, unspecified: Secondary | ICD-10-CM | POA: Diagnosis present

## 2018-11-06 LAB — BASIC METABOLIC PANEL
Anion gap: 14 (ref 5–15)
BUN: 9 mg/dL (ref 6–20)
CO2: 17 mmol/L — ABNORMAL LOW (ref 22–32)
Calcium: 8 mg/dL — ABNORMAL LOW (ref 8.9–10.3)
Chloride: 105 mmol/L (ref 98–111)
Creatinine, Ser: 0.56 mg/dL (ref 0.44–1.00)
GFR calc Af Amer: >60 mL/min (ref >60)
GFR calc non Af Amer: >60 mL/min (ref >60)
Glucose, Bld: 93 mg/dL (ref 70–99)
Potassium: 3.6 mmol/L (ref 3.5–5.1)
Sodium: 136 mmol/L (ref 135–145)

## 2018-11-06 LAB — CBC
HCT: 39.7 % (ref 36.0–46.0)
Hemoglobin: 13.1 g/dL (ref 12.0–15.0)
MCH: 30.7 pg (ref 26.0–34.0)
MCHC: 33 g/dL (ref 30.0–36.0)
MCV: 93 fL (ref 80.0–100.0)
Platelets: 320 10*3/uL (ref 150–400)
RBC: 4.27 MIL/uL (ref 3.87–5.11)
RDW: 13 % (ref 11.5–15.5)
WBC: 12.4 10*3/uL — ABNORMAL HIGH (ref 4.0–10.5)
nRBC: 0 % (ref 0.0–0.2)

## 2018-11-06 LAB — CBC WITH DIFFERENTIAL/PLATELET
Abs Immature Granulocytes: 0.05 10*3/uL (ref 0.00–0.07)
Basophils Absolute: 0 10*3/uL (ref 0.0–0.1)
Basophils Relative: 0 %
Eosinophils Absolute: 0 10*3/uL (ref 0.0–0.5)
Eosinophils Relative: 0 %
HCT: 43.7 % (ref 36.0–46.0)
Hemoglobin: 14.6 g/dL (ref 12.0–15.0)
Immature Granulocytes: 0 %
Lymphocytes Relative: 12 %
Lymphs Abs: 2 10*3/uL (ref 0.7–4.0)
MCH: 30.7 pg (ref 26.0–34.0)
MCHC: 33.4 g/dL (ref 30.0–36.0)
MCV: 92 fL (ref 80.0–100.0)
Monocytes Absolute: 0.7 10*3/uL (ref 0.1–1.0)
Monocytes Relative: 4 %
Neutro Abs: 13.8 10*3/uL — ABNORMAL HIGH (ref 1.7–7.7)
Neutrophils Relative %: 84 %
Platelets: 376 10*3/uL (ref 150–400)
RBC: 4.75 MIL/uL (ref 3.87–5.11)
RDW: 13 % (ref 11.5–15.5)
WBC: 16.6 10*3/uL — ABNORMAL HIGH (ref 4.0–10.5)
nRBC: 0 % (ref 0.0–0.2)

## 2018-11-06 LAB — COMPREHENSIVE METABOLIC PANEL
ALT: 18 U/L (ref 0–44)
AST: 27 U/L (ref 15–41)
Albumin: 4.4 g/dL (ref 3.5–5.0)
Alkaline Phosphatase: 51 U/L (ref 38–126)
Anion gap: 14 (ref 5–15)
BUN: 10 mg/dL (ref 6–20)
CO2: 22 mmol/L (ref 22–32)
Calcium: 9.4 mg/dL (ref 8.9–10.3)
Chloride: 101 mmol/L (ref 98–111)
Creatinine, Ser: 0.68 mg/dL (ref 0.44–1.00)
GFR calc Af Amer: >60 mL/min (ref >60)
GFR calc non Af Amer: >60 mL/min (ref >60)
Glucose, Bld: 102 mg/dL — ABNORMAL HIGH (ref 70–99)
Potassium: 3.7 mmol/L (ref 3.5–5.1)
Sodium: 137 mmol/L (ref 135–145)
Total Bilirubin: 0.7 mg/dL (ref 0.3–1.2)
Total Protein: 7.4 g/dL (ref 6.5–8.1)

## 2018-11-06 LAB — HIV ANTIBODY (ROUTINE TESTING W REFLEX): HIV Screen 4th Generation wRfx: NONREACTIVE

## 2018-11-06 LAB — SARS CORONAVIRUS 2 BY RT PCR (HOSPITAL ORDER, PERFORMED IN ~~LOC~~ HOSPITAL LAB): SARS Coronavirus 2: NEGATIVE

## 2018-11-06 LAB — HCG, QUANTITATIVE, PREGNANCY: hCG, Beta Chain, Quant, S: 23895 m[IU]/mL — ABNORMAL HIGH (ref <5)

## 2018-11-06 LAB — LIPASE, BLOOD: Lipase: 22 U/L (ref 11–51)

## 2018-11-06 MED ORDER — SODIUM CHLORIDE 0.9 % IV SOLN
INTRAVENOUS | Status: DC
  Administered 2018-11-06: 05:00:00 via INTRAVENOUS

## 2018-11-06 MED ORDER — ALUM & MAG HYDROXIDE-SIMETH 200-200-20 MG/5ML PO SUSP
30.0000 mL | Freq: Four times a day (QID) | ORAL | Status: DC | PRN
  Administered 2018-11-06: 30 mL via ORAL
  Filled 2018-11-06: qty 30

## 2018-11-06 MED ORDER — ACETAMINOPHEN 500 MG PO TABS
1000.0000 mg | ORAL_TABLET | Freq: Once | ORAL | Status: AC
  Administered 2018-11-06: 05:00:00 1000 mg via ORAL
  Filled 2018-11-06: qty 2

## 2018-11-06 MED ORDER — SODIUM CHLORIDE 0.9 % IV SOLN
INTRAVENOUS | Status: DC
  Administered 2018-11-06 – 2018-11-07 (×4): via INTRAVENOUS

## 2018-11-06 MED ORDER — PROMETHAZINE HCL 25 MG/ML IJ SOLN
25.0000 mg | Freq: Four times a day (QID) | INTRAMUSCULAR | Status: DC | PRN
  Administered 2018-11-06 (×2): 25 mg via INTRAVENOUS
  Filled 2018-11-06 (×2): qty 1

## 2018-11-06 MED ORDER — PANTOPRAZOLE SODIUM 40 MG IV SOLR
40.0000 mg | Freq: Every day | INTRAVENOUS | Status: DC
  Administered 2018-11-06: 40 mg via INTRAVENOUS
  Filled 2018-11-06 (×2): qty 40

## 2018-11-06 MED ORDER — LIDOCAINE VISCOUS HCL 2 % MT SOLN
15.0000 mL | Freq: Four times a day (QID) | OROMUCOSAL | Status: DC | PRN
  Administered 2018-11-06: 15 mL via ORAL
  Filled 2018-11-06: qty 15

## 2018-11-06 MED ORDER — PROMETHAZINE HCL 25 MG/ML IJ SOLN
25.0000 mg | Freq: Once | INTRAMUSCULAR | Status: AC
  Administered 2018-11-06: 05:00:00 25 mg via INTRAVENOUS
  Filled 2018-11-06: qty 1

## 2018-11-06 MED ORDER — ENOXAPARIN SODIUM 40 MG/0.4ML ~~LOC~~ SOLN
40.0000 mg | SUBCUTANEOUS | Status: DC
  Filled 2018-11-06: qty 0.4

## 2018-11-06 MED ORDER — ACETAMINOPHEN 325 MG PO TABS: 650.0000 mg | ORAL_TABLET | Freq: Four times a day (QID) | ORAL | Status: DC | PRN

## 2018-11-06 MED ORDER — ONDANSETRON HCL 4 MG/2ML IJ SOLN
4.0000 mg | Freq: Once | INTRAMUSCULAR | Status: AC
  Administered 2018-11-06: 4 mg via INTRAVENOUS
  Filled 2018-11-06: qty 2

## 2018-11-06 MED ORDER — FAMOTIDINE IN NACL 20-0.9 MG/50ML-% IV SOLN
20.0000 mg | Freq: Two times a day (BID) | INTRAVENOUS | Status: DC
  Administered 2018-11-06 – 2018-11-07 (×3): 20 mg via INTRAVENOUS
  Filled 2018-11-06 (×3): qty 50

## 2018-11-06 MED ORDER — DICLOFENAC SODIUM 1 % TD GEL
2.0000 g | Freq: Four times a day (QID) | TRANSDERMAL | Status: DC
  Administered 2018-11-06 – 2018-11-07 (×4): 2 g via TOPICAL
  Filled 2018-11-06: qty 100

## 2018-11-06 MED ORDER — SODIUM CHLORIDE 0.9 % IV SOLN
INTRAVENOUS | Status: DC
  Administered 2018-11-06: 08:00:00 via INTRAVENOUS

## 2018-11-06 NOTE — Progress Notes (Addendum)
Patient admitted after midnight with intractable nausea and vomiting concerning for hyperemesis gravidarum. Patient is approximately [redacted] weeks pregnant.   Intractable nausea and vomiting, suspect secondary to hyperemesis gravidarum. Patient is approximately 6 weeks and 1 day pregnant.  Presenting with a 2-week history of intractable nausea and vomiting.  White count 16.6 with left shift. Lipase and LFTs normal.  No significant electrolyte derangements. Seen in the ED on May 4 and right upper quadrant ultrasound done done at that time without evidence of gallstones, no gallbladder wall thickening, negative. Continues with n/v  -IV fluid hydration -IV Phenergan PRN -IV Protonix -IV Pepcid, GI cocktail PRN -Continue to monitor chemistry panel  Hypotension: likely related to volume.  -vigorous IV fluids -monitor closely   Leukocytosis. Afebrile.  White count 16.6 with left shift.  Patient reports urinary frequency but no dysuria. Lungs clear on chest x-ray done a week ago. -UA pending -Continue to monitor CBC  Noncardiac chest pain. Improved this am. Present at rest and ongoing for the past 2 weeks.  Likely musculoskeletal as she has chest wall point tenderness on exam.  Chest x-ray done a week ago without evidence of rib fracture. EKG not suggestive of ACS. -Tylenol PRN -Voltaren gel -IV PPI -IV Protonix, IV Pepcid, GI cocktail PRN  Pregnancy Approximately 6 weeks and 1 day pregnant.  Patient has expressed that she wants to terminate this pregnancy as she has had complications during her prior pregnancy.  She currently does not have OB/GYN follow-up. -OP follow up  Clydie Braun Rashi Granier,np

## 2018-11-06 NOTE — ED Notes (Signed)
The pt vopmited back the maalox and lidocaine

## 2018-11-06 NOTE — Progress Notes (Signed)
NEW ADMISSION NOTE New Admission Note:   Arrival Method: ED via stretcher  Mental Orientation: axox4 Telemetry: no orders  Assessment: Completed Skin:intact  IV: infusing LAC  Pain: chest pain  8/10 related to vomiting (see MAR)  Tubes:none  Safety Measures: Safety Fall Prevention Plan has been discussed  Admission: Completed [facility] Orientation: Patient has been orientated to the room, unit and staff.  Family: none   Orders have been reviewed and implemented. Will continue to monitor the patient. Call light has been placed within reach and bed alarm has been activated.   Leonia Reeves, RN

## 2018-11-06 NOTE — ED Notes (Signed)
ED TO INPATIENT HANDOFF REPORT  ED Nurse Name and Phone #:  [phone]  S Name/Age/Gender Meagan Schmidt 33 y.o. female Room/Bed: 032C/032C  Code Status   Code Status: Full Code  Home/SNF/Other Home Patient oriented to: self, place, time and situation Is this baseline? Yes   Triage Complete: Triage complete  Chief Complaint SHOB, Nausea, Vomiting  Triage Note   Pt arrives via taxi for eval of chest pain and dry heaves. Pt was seen and worked up at [facility] long this afternoon. Pt is also 5-[redacted] weeks pregnant, states she went to women[facility] for workup "but it was closed".  Pt is dry heaving w/ no actually emesis output. Tearful and rolling around in the bed, crying out periodically. Pain and discomfort is distractable.    Allergies No Known Allergies  Level of Care/Admitting Diagnosis ED Disposition    ED Disposition Condition Comment   Admit  Hospital Area: [facility] [100100]  Level of Care: Med-Surg [16]  I expect the patient will be discharged within 24 hours: Yes  LOW acuity---Tx typically complete <24 hrs---ACUTE conditions typically can be evaluated <24 hours---LABS likely to return to acceptable levels <24 hours---IS near functional baseline---EXPECTED to return to current living arrangement---NOT newly hypoxic: Meets criteria for 5C-Observation unit  Covid Evaluation: Person Under Investigation (PUI)  Isolation Risk Level: Low Risk/Droplet (Less than 4L [place] supplementation)  Diagnosis: Intractable nausea and vomiting [720114]  Admitting Physician: John Giovanni [[id number]]  Attending Physician: John Giovanni [[id number]]  PT Class (Do Not Modify): Observation [104]  PT Acc Code (Do Not Modify): Observation [10022]       B Medical/Surgery History History reviewed. No pertinent past medical history. History reviewed. No pertinent surgical history.   A IV Location/Drains/Wounds Patient Lines/Drains/Airways Status   Active  Line/Drains/Airways    Name:   Placement date:   Placement time:   Site:   Days:   Peripheral IV 11/06/18 Left Forearm   11/06/18    0055    Forearm   less than 1          Intake/Output Last 24 hours  Intake/Output Summary (Last 24 hours) at 11/06/2018 0802 Last data filed at 11/06/2018 0802 Gross per 24 hour  Intake 2391.39 ml  Output -  Net 2391.39 ml    Labs/Imaging Results for orders placed or performed during the hospital encounter of 11/05/18 (from the past 48 hour(s))  CBC with Differential     Status: Abnormal   Collection Time: 11/06/18  1:05 AM  Result Value Ref Range   WBC 16.6 (H) 4.0 - 10.5 K/uL   RBC 4.75 3.87 - 5.11 MIL/uL   Hemoglobin 14.6 12.0 - 15.0 g/dL   HCT 27.0 62.3 - 76.2 %   MCV 92.0 80.0 - 100.0 fL   MCH 30.7 26.0 - 34.0 pg   MCHC 33.4 30.0 - 36.0 g/dL   RDW 83.1 51.7 - 61.6 %   Platelets 376 150 - 400 K/uL   nRBC 0.0 0.0 - 0.2 %   Neutrophils Relative % 84 %   Neutro Abs 13.8 (H) 1.7 - 7.7 K/uL   Lymphocytes Relative 12 %   Lymphs Abs 2.0 0.7 - 4.0 K/uL   Monocytes Relative 4 %   Monocytes Absolute 0.7 0.1 - 1.0 K/uL   Eosinophils Relative 0 %   Eosinophils Absolute 0.0 0.0 - 0.5 K/uL   Basophils Relative 0 %   Basophils Absolute 0.0 0.0 - 0.1 K/uL   Immature Granulocytes 0 %  Abs Immature Granulocytes 0.05 0.00 - 0.07 K/uL    Comment: Performed at [facility], [address] [place], [place] [address]  Comprehensive metabolic panel     Status: Abnormal   Collection Time: 11/06/18  1:05 AM  Result Value Ref Range   Sodium 137 135 - 145 mmol/L   Potassium 3.7 3.5 - 5.1 mmol/L   Chloride 101 98 - 111 mmol/L   CO2 22 22 - 32 mmol/L   Glucose, Bld 102 (H) 70 - 99 mg/dL   BUN 10 6 - 20 mg/dL   Creatinine, Ser 6.04 0.44 - 1.00 mg/dL   Calcium 9.4 8.9 - 54.0 mg/dL   Total Protein 7.4 6.5 - 8.1 g/dL   Albumin 4.4 3.5 - 5.0 g/dL   AST 27 15 - 41 U/L   ALT 18 0 - 44 U/L   Alkaline Phosphatase 51 38 - 126 U/L   Total Bilirubin 0.7  0.3 - 1.2 mg/dL   GFR calc non Af Amer >60 >60 mL/min   GFR calc Af Amer >60 >60 mL/min   Anion gap 14 5 - 15    Comment: Performed at [facility], [address] [place], [place] [address]  Lipase, blood     Status: None   Collection Time: 11/06/18  1:05 AM  Result Value Ref Range   Lipase 22 11 - 51 U/L    Comment: Performed at [facility], [address] [place], [place] [address]  hCG, quantitative, pregnancy     Status: Abnormal   Collection Time: 11/06/18  1:05 AM  Result Value Ref Range   hCG, Beta Chain, Quant, S 23,895 (H) <5 mIU/mL    Comment:          GEST. AGE      CONC.  (mIU/mL)   <=1 WEEK        5 - 50     2 WEEKS       50 - 500     3 WEEKS       100 - 10,000     4 WEEKS     1,000 - 30,000     5 WEEKS     3,500 - 115,000   6-8 WEEKS     12,000 - 270,000    12 WEEKS     15,000 - 220,000        FEMALE AND NON-PREGNANT FEMALE:     LESS THAN 5 mIU/mL Performed at [facility], [address], [place], [place] [address]   SARS Coronavirus 2 (CEPHEID - Performed in [facility] hospital lab), Hosp Order     Status: None   Collection Time: 11/06/18  5:00 AM  Result Value Ref Range   SARS Coronavirus 2 NEGATIVE NEGATIVE    Comment: (NOTE) If result is NEGATIVE SARS-CoV-2 target nucleic acids are NOT DETECTED. The SARS-CoV-2 RNA is generally detectable in upper and lower  respiratory specimens during the acute phase of infection. The lowest  concentration of SARS-CoV-2 viral copies this assay can detect is 250  copies / mL. A negative result does not preclude SARS-CoV-2 infection  and should not be used as the sole basis for treatment or other  patient management decisions.  A negative result may occur with  improper specimen collection / handling, submission of specimen other  than nasopharyngeal swab, presence of viral mutation(s) within the  areas targeted by this assay, and inadequate number of viral copies  (<  250 copies / mL). A negative  result must be combined with clinical  observations, patient history, and epidemiological information. If result is POSITIVE SARS-CoV-2 target nucleic acids are DETECTED. The SARS-CoV-2 RNA is generally detectable in upper and lower  respiratory specimens dur ing the acute phase of infection.  Positive  results are indicative of active infection with SARS-CoV-2.  Clinical  correlation with patient history and other diagnostic information is  necessary to determine patient infection status.  Positive results do  not rule out bacterial infection or co-infection with other viruses. If result is PRESUMPTIVE POSTIVE SARS-CoV-2 nucleic acids MAY BE PRESENT.   A presumptive positive result was obtained on the submitted specimen  and confirmed on repeat testing.  While 2019 novel coronavirus  (SARS-CoV-2) nucleic acids may be present in the submitted sample  additional confirmatory testing may be necessary for epidemiological  and / or clinical management purposes  to differentiate between  SARS-CoV-2 and other Sarbecovirus currently known to infect humans.  If clinically indicated additional testing with an alternate test  methodology [phone]([id number]) is advised. The SARS-CoV-2 RNA is generally  detectable in upper and lower respiratory sp ecimens during the acute  phase of infection. The expected result is Negative. Fact Sheet for Patients:  BoilerBrush.com.cyhttps://www.fda.gov/media/136312/download Fact Sheet for Healthcare Providers: https://pope.com/https://www.fda.gov/media/136313/download This test is not yet approved or cleared by the [place] FDA and has been authorized for detection and/or diagnosis of SARS-CoV-2 by FDA under an Emergency Use Authorization (EUA).  This EUA will remain in effect (meaning this test can be used) for the duration of the COVID-19 declaration under Section 564(b)(1) of the Act, 21 U.S.C. section 360bbb-3(b)(1), unless the authorization is terminated or revoked sooner. Performed at [facility], [address] [place], [place] [address]   Basic metabolic panel     Status: Abnormal   Collection Time: 11/06/18  6:12 AM  Result Value Ref Range   Sodium 136 135 - 145 mmol/L   Potassium 3.6 3.5 - 5.1 mmol/L   Chloride 105 98 - 111 mmol/L   CO2 17 (L) 22 - 32 mmol/L   Glucose, Bld 93 70 - 99 mg/dL   BUN 9 6 - 20 mg/dL   Creatinine, Ser 8.110.56 0.44 - 1.00 mg/dL   Calcium 8.0 (L) 8.9 - 10.3 mg/dL   GFR calc non Af Amer >60 >60 mL/min   GFR calc Af Amer >60 >60 mL/min   Anion gap 14 5 - 15    Comment: Performed at [facility], [address] [place], [place] [address]  CBC     Status: Abnormal   Collection Time: 11/06/18  6:12 AM  Result Value Ref Range   WBC 12.4 (H) 4.0 - 10.5 K/uL   RBC 4.27 3.87 - 5.11 MIL/uL   Hemoglobin 13.1 12.0 - 15.0 g/dL   HCT 29.539.7 62.136.0 - 30.846.0 %   MCV 93.0 80.0 - 100.0 fL   MCH 30.7 26.0 - 34.0 pg   MCHC 33.0 30.0 - 36.0 g/dL   RDW 65.713.0 84.611.5 - 96.215.5 %   Platelets 320 150 - 400 K/uL   nRBC 0.0 0.0 - 0.2 %    Comment: Performed at [facility], [address] [place], [place] [address]   No results found.  Pending Labs Unresulted Labs (From admission, onward)    Start     Ordered   11/06/18 0538  HIV antibody (Routine Testing)  Once,   R  11/06/18 0540   11/05/18 2321  Urinalysis, Routine w reflex microscopic  ONCE - STAT,   R     11/05/18 2321          Vitals/Pain Today's Vitals   11/06/18 0634 11/06/18 0700 11/06/18 0730 11/06/18 0758  BP: (!) 84/58 (!) 87/51 (!) 82/45 (!) 107/55  Pulse: 66 66 67 79  Resp: 15   16  Temp:    98.7 F (37.1 C)  TempSrc:    Oral  SpO2: 97% 99% 95% 99%  Weight:      Height:      PainSc:    8     Isolation Precautions No active isolations  Medications Medications  0.9 %  sodium chloride infusion ( Intravenous New Bag/Given 11/06/18 0758)  acetaminophen (TYLENOL) tablet 650 mg (has no administration in time range)  alum & mag hydroxide-simeth (MAALOX/MYLANTA)  200-200-20 MG/5ML suspension 30 mL (has no administration in time range)    And  lidocaine (XYLOCAINE) 2 % viscous mouth solution 15 mL (has no administration in time range)  famotidine (PEPCID) IVPB 20 mg premix (has no administration in time range)  promethazine (PHENERGAN) injection 25 mg (has no administration in time range)  pantoprazole (PROTONIX) injection 40 mg (has no administration in time range)  enoxaparin (LOVENOX) injection 40 mg (has no administration in time range)  diclofenac sodium (VOLTAREN) 1 % transdermal gel 2 g (has no administration in time range)  sodium chloride 0.9 % bolus 1,000 mL (0 mLs Intravenous Stopped 11/06/18 0434)  sodium chloride 0.9 % bolus 1,000 mL (0 mLs Intravenous Stopped 11/06/18 0213)  alum & mag hydroxide-simeth (MAALOX/MYLANTA) 200-200-20 MG/5ML suspension 30 mL (30 mLs Oral Given 11/06/18 0214)    And  lidocaine (XYLOCAINE) 2 % viscous mouth solution 15 mL (15 mLs Oral Given 11/06/18 0214)  metoCLOPramide (REGLAN) injection 10 mg (10 mg Intravenous Given 11/06/18 0056)  famotidine (PEPCID) IVPB 20 mg premix (0 mg Intravenous Stopped 11/06/18 0213)  ondansetron (ZOFRAN) injection 4 mg (4 mg Intravenous Given 11/06/18 0253)  acetaminophen (TYLENOL) tablet 1,000 mg (1,000 mg Oral Given 11/06/18 0456)  promethazine (PHENERGAN) injection 25 mg (25 mg Intravenous Given 11/06/18 0450)    Mobility walks Low fall risk   Focused Assessments Cardiac Assessment Handoff:    Lab Results  Component Value Date   TROPONINI <0.03 10/30/2018   Lab Results  Component Value Date   DDIMER 0.29 12/31/2017   Does the Patient currently have chest pain? Yes  Pain increases with movement   R Recommendations: See Admitting Provider Note  Report given to:   Additional Notes:

## 2018-11-06 NOTE — ED Notes (Signed)
Nausea med given 

## 2018-11-06 NOTE — ED Notes (Signed)
Actively vomiting at present

## 2018-11-06 NOTE — ED Notes (Signed)
Pt is sleeping

## 2018-11-06 NOTE — ED Notes (Signed)
pts boyfriend/ "baby daddy" would like an update if possible at [phone]

## 2018-11-06 NOTE — H&P (Signed)
History and Physical    Richardine ServiceKathleen Michelle Schmidt ZOX:[id number] DOB: [birth year] DOA: 11/05/2018  PCP: System, Pcp Not In Patient coming from: Home  Chief Complaint: Chest pain, vomiting  HPI: Colan NeptuneKathleen Michelle Schmidt is a 33 y.o. female G2, P1 who is approximately 6 weeks and 1 day pregnant presenting to the hospital for evaluation of chest pain and vomiting.  Patient states all her symptoms started 2 weeks ago and have been ongoing.  She is having pain in her chest in the substernal region which she describes as " pinching and burning."  She has been vomiting all the time and not been able to keep any food down.  She has been noticing some streaks of blood in the vomit.  Reports GERD symptoms and a burning sensation in her chest.  Denies any abdominal pain.  She is not sure if she is having any fevers.  Reports feeling hot and cold every time she vomits.  States she was prescribed a medicine for nausea when she visited the emergency room on May 4 but has not been able to get it as she does not have any money.  States she does not see an OB/GYN.  States she wants to terminate her pregnancy as she had complications during her prior pregnancy.  States she had vaginal spotting before she was seen in the ED on May 4 but not since then.  Denies any dysuria but does report urinary frequency.  Patient was seen in the ED on May 4 for nausea, vomiting, and chest pain.  Chest x-ray done at that time showing no active disease.  Pelvic ultrasound showing intrauterine gestational sac without yolk sac or embryo.  Mean sac diameter compatible with estimated gestational age [redacted] weeks 3 days.  Findings thought to represent a normal early pregnancy and less likely an embryonic pregnancy.  Right upper quadrant ultrasound without evidence of gallstones, no gallbladder wall thickening, negative sonographic Murphy sign, and common bile duct diameter 0.4 cm.  She was discharged home with antiemetics.  ED  Course: Patient vomited in the ED prior to receiving antiemetics and did appear to have streaks of blood.  No large-volume hematemesis.  Patient continued to vomit in the ED after any p.o. intake. Afebrile, not tachycardic, blood pressure stable.  White count 16.6 with left shift.  Hemoglobin 14.6.  No significant electrolyte abnormalities.  Blood glucose 102.  Lipase and LFTs normal.  Beta hCG 81,[id number]. UA pending. Received Tylenol, GI cocktail, Reglan, Zofran, Phenergan Pepcid, and 2 L IV fluid boluses in the ED.  Review of Systems: As per HPI otherwise 10 point review of systems negative.  History reviewed. No pertinent past medical history.  History reviewed. No pertinent surgical history.   reports that she has been smoking cigarettes. She has never used smokeless tobacco. No history on file for alcohol and drug.  No Known Allergies  History reviewed. No pertinent family history.  Prior to Admission medications   Medication Sig Start Date End Date Taking? Authorizing Provider  calcium carbonate (TUMS) 500 MG chewable tablet Chew 1 tablet (200 mg of elemental calcium total) by mouth 3 (three) times daily as needed for indigestion or heartburn. 10/30/18   Shaune PollackIsaacs, Cameron, MD  Camphor-Eucalyptus-Menthol (VICKS VAPORUB) 4.7-1.2-2.6 % OINT Apply 1 application topically daily as needed (applied to chest for pain).    [provider]  Doxylamine-Pyridoxine 10-10 MG TBEC Take 1 tablet by mouth 3 (three) times daily as needed (nausea, vomiting). Patient not taking: Reported on 11/05/2018 10/30/18  Shaune Pollack, MD  famotidine (PEPCID) 20 MG tablet Take 1 tablet (20 mg total) by mouth daily for 7 days. Patient not taking: Reported on 11/05/2018 10/30/18 11/06/18  Shaune Pollack, MD  ibuprofen (ADVIL,MOTRIN) 800 MG tablet Take 1 tablet (800 mg total) by mouth every 8 (eight) hours as needed for moderate pain. Patient not taking: Reported on 10/30/2018 12/31/17   Maia Plan, MD    Physical  Exam: Vitals:   11/06/18 0300 11/06/18 0400 11/06/18 0430 11/06/18 0500  BP: (!) 99/54 (!) 95/49 (!) 90/54 107/72  Pulse: 71 71 69 72  Resp:      Temp:      TempSrc:      SpO2: 98% 98% 98% 98%  Weight:      Height:        Physical Exam  Constitutional: She is oriented to person, place, and time. She appears well-developed and well-nourished. No distress.  HENT:  Head: Normocephalic.  Dry mucous membranes  Eyes: Right eye exhibits no discharge. Left eye exhibits no discharge.  Neck: Neck supple.  Cardiovascular: Normal rate, regular rhythm and intact distal pulses.  Pulmonary/Chest: Effort normal and breath sounds normal. No respiratory distress. She has no wheezes. She has no rales.  Has point tenderness on palpation of chest wall slightly left to the sternum  Abdominal: Soft. Bowel sounds are normal. She exhibits no distension. There is abdominal tenderness. There is no rebound and no guarding.  Epigastrium mildly tender to palpation  Musculoskeletal:        General: No edema.  Neurological: She is alert and oriented to person, place, and time.  Skin: Skin is warm and dry. She is not diaphoretic.     Labs on Admission: I have personally reviewed following labs and imaging studies  CBC: Recent Labs  Lab 10/30/18 1314 11/06/18 0105  WBC 11.0* 16.6*  NEUTROABS  --  13.8*  HGB 16.3* 14.6  HCT 49.8* 43.7  MCV 93.4 92.0  PLT 350 376   Basic Metabolic Panel: Recent Labs  Lab 10/30/18 1323 11/06/18 0105  NA 135 137  K 3.3* 3.7  CL 103 101  CO2 21* 22  GLUCOSE 93 102*  BUN 13 10  CREATININE 0.66 0.68  CALCIUM 9.0 9.4   GFR: Estimated Creatinine Clearance: 84.1 mL/min (by C-G formula based on SCr of 0.68 mg/dL). Liver Function Tests: Recent Labs  Lab 10/30/18 1323 11/06/18 0105  AST 21 27  ALT 16 18  ALKPHOS 55 51  BILITOT 1.1 0.7  PROT 8.1 7.4  ALBUMIN 4.5 4.4   Recent Labs  Lab 10/30/18 1323 11/06/18 0105  LIPASE 19 22   No results for  input(s): AMMONIA in the last 168 hours. Coagulation Profile: No results for input(s): INR, PROTIME in the last 168 hours. Cardiac Enzymes: Recent Labs  Lab 10/30/18 1314  TROPONINI <0.03   BNP (last 3 results) No results for input(s): PROBNP in the last 8760 hours. HbA1C: No results for input(s): HGBA1C in the last 72 hours. CBG: No results for input(s): GLUCAP in the last 168 hours. Lipid Profile: No results for input(s): CHOL, HDL, LDLCALC, TRIG, CHOLHDL, LDLDIRECT in the last 72 hours. Thyroid Function Tests: No results for input(s): TSH, T4TOTAL, FREET4, T3FREE, THYROIDAB in the last 72 hours. Anemia Panel: No results for input(s): VITAMINB12, FOLATE, FERRITIN, TIBC, IRON, RETICCTPCT in the last 72 hours. Urine analysis:    Component Value Date/Time   COLORURINE AMBER (A) 10/30/2018 1543   APPEARANCEUR HAZY (A) 10/30/2018  1543   LABSPEC 1.040 (H) 10/30/2018 1543   PHURINE 5.0 10/30/2018 1543   GLUCOSEU NEGATIVE 10/30/2018 1543   HGBUR NEGATIVE 10/30/2018 1543   BILIRUBINUR NEGATIVE 10/30/2018 1543   KETONESUR 80 (A) 10/30/2018 1543   PROTEINUR 30 (A) 10/30/2018 1543   NITRITE NEGATIVE 10/30/2018 1543   LEUKOCYTESUR NEGATIVE 10/30/2018 1543    Radiological Exams on Admission: No results found.  EKG: Independently reviewed.  Sinus rhythm, no significant change since prior tracing.  Assessment/Plan Principal Problem:   Intractable nausea and vomiting Active Problems:   Hyperemesis gravidarum   Leukocytosis   Chest pain   Pregnancy   Intractable nausea and vomiting, suspect secondary to hyperemesis gravidarum Patient is approximately 6 weeks and 1 day pregnant.  Presenting with a 2-week history of intractable nausea and vomiting.  White count 16.6 with left shift.  Patient is afebrile.  Has mild epigastric tenderness on exam but no peritoneal signs.  Lipase and LFTs normal.  No significant electrolyte derangements.  Continued to vomit in the ED after any p.o.  intake.  Noted to have some streaks of blood when she vomited in the ED but no large volume hematemesis.  Hemoglobin 14.6.  Seen in the ED on May 4 and right upper quadrant ultrasound done done at that time without evidence of gallstones, no gallbladder wall thickening, negative sonographic Murphy sign, and common bile duct diameter 0.4 cm. GERD/gastritis could also be contributing to her symptoms.  -IV fluid hydration -IV Phenergan PRN -IV Protonix -IV Pepcid, GI cocktail PRN -Continue to monitor chemistry panel  Leukocytosis Afebrile.  White count 16.6 with left shift.  Patient reports urinary frequency but no dysuria.  Lungs clear on exam and no signs of respiratory distress.  No cough noted.  Lungs clear on chest x-ray done a week ago. -UA pending -Continue to monitor CBC  Noncardiac chest pain Present at rest and ongoing for the past 2 weeks.  Likely musculoskeletal as she has chest wall point tenderness on exam.  Chest x-ray done a week ago without evidence of rib fracture.  GERD/gastritis could also be contributing as she reports burning sensation in her chest.  EKG not suggestive of ACS. -Tylenol PRN -Voltaren gel -IV PPI -IV Protonix, IV Pepcid, GI cocktail PRN  Pregnancy Approximately 6 weeks and 1 day pregnant.  Patient has expressed that she wants to terminate this pregnancy as she has had complications during her prior pregnancy.  She currently does not have OB/GYN follow-up. -Consult OB in a.m.  DVT prophylaxis: Lovenox Code Status: Full code Family Communication: No family available. Disposition Plan: Anticipate discharge in 1 to 2 days. Consults called: None Admission status: Observation  This chart was dictated using voice recognition software.  Despite best efforts to proofread, errors can occur which can change the documentation meaning.  John Giovanni MD [facility] Pager [phone]  If 7PM-7AM, please contact night-coverage [email or web address]  Password [facility]  11/06/2018, 5:52 AM

## 2018-11-07 DIAGNOSIS — R112 Nausea with vomiting, unspecified: Secondary | ICD-10-CM | POA: Diagnosis not present

## 2018-11-07 LAB — BASIC METABOLIC PANEL
Anion gap: 5 (ref 5–15)
BUN: 7 mg/dL (ref 6–20)
CO2: 20 mmol/L — ABNORMAL LOW (ref 22–32)
Calcium: 7.8 mg/dL — ABNORMAL LOW (ref 8.9–10.3)
Chloride: 110 mmol/L (ref 98–111)
Creatinine, Ser: 0.6 mg/dL (ref 0.44–1.00)
GFR calc Af Amer: >60 mL/min (ref >60)
GFR calc non Af Amer: >60 mL/min (ref >60)
Glucose, Bld: 89 mg/dL (ref 70–99)
Potassium: 3.6 mmol/L (ref 3.5–5.1)
Sodium: 135 mmol/L (ref 135–145)

## 2018-11-07 LAB — CBC
HCT: 37.8 % (ref 36.0–46.0)
Hemoglobin: 12.4 g/dL (ref 12.0–15.0)
MCH: 30.4 pg (ref 26.0–34.0)
MCHC: 32.8 g/dL (ref 30.0–36.0)
MCV: 92.6 fL (ref 80.0–100.0)
Platelets: 292 10*3/uL (ref 150–400)
RBC: 4.08 MIL/uL (ref 3.87–5.11)
RDW: 13.1 % (ref 11.5–15.5)
WBC: 10 10*3/uL (ref 4.0–10.5)
nRBC: 0 % (ref 0.0–0.2)

## 2018-11-07 MED ORDER — VITAMIN B-6 25 MG PO TABS
25.0000 mg | ORAL_TABLET | Freq: Three times a day (TID) | ORAL | Status: DC
  Administered 2018-11-07: 25 mg via ORAL
  Filled 2018-11-07 (×2): qty 1

## 2018-11-07 MED ORDER — PANTOPRAZOLE SODIUM 40 MG PO TBEC
40.0000 mg | DELAYED_RELEASE_TABLET | Freq: Every day | ORAL | Status: DC
  Administered 2018-11-07: 40 mg via ORAL
  Filled 2018-11-07: qty 1

## 2018-11-07 MED ORDER — MAGNESIUM SULFATE 2 GM/50ML IV SOLN
2.0000 g | Freq: Once | INTRAVENOUS | Status: AC
  Administered 2018-11-07: 2 g via INTRAVENOUS
  Filled 2018-11-07: qty 50

## 2018-11-07 MED ORDER — PANTOPRAZOLE SODIUM 40 MG PO TBEC: 40.0000 mg | DELAYED_RELEASE_TABLET | Freq: Every day | ORAL | 1 refills | Status: DC

## 2018-11-07 MED ORDER — DOXYLAMINE SUCCINATE (SLEEP) 25 MG PO TABS
25.0000 mg | ORAL_TABLET | Freq: Every day | ORAL | Status: DC
  Filled 2018-11-07: qty 1

## 2018-11-07 MED ORDER — FAMOTIDINE 20 MG PO TABS: 20.0000 mg | ORAL_TABLET | Freq: Two times a day (BID) | ORAL | 1 refills | Status: DC

## 2018-11-07 MED ORDER — FAMOTIDINE 20 MG PO TABS
20.0000 mg | ORAL_TABLET | Freq: Two times a day (BID) | ORAL | Status: DC
  Filled 2018-11-07: qty 1

## 2018-11-07 MED ORDER — DOXYLAMINE SUCCINATE (SLEEP) 25 MG PO TABS: 25.0000 mg | ORAL_TABLET | Freq: Every day | ORAL | 0 refills | Status: DC

## 2018-11-07 MED ORDER — PYRIDOXINE HCL 25 MG PO TABS: 25.0000 mg | ORAL_TABLET | Freq: Three times a day (TID) | ORAL | 1 refills | Status: DC

## 2018-11-07 NOTE — Discharge Instructions (Signed)
[facility] [address], [place], [place] [address] [phone] [email or web address]

## 2018-11-07 NOTE — Progress Notes (Signed)
DISCHARGE NOTE HOME Meagan Schmidt to be discharged Home per MD order. Discussed prescriptions and follow up appointments with the patient. Prescriptions given to patient; medication list explained in detail. Patient verbalized understanding.  Skin clean, dry and intact without evidence of skin break down, no evidence of skin tears noted. IV catheter discontinued intact. Site without signs and symptoms of complications. Dressing and pressure applied. Pt denies pain at the site currently. No complaints noted.  Patient free of lines, drains, and wounds.   An After Visit Summary (AVS) was printed and given to the patient. Patient escorted via wheelchair, and discharged home via private auto.  Leonia Reeves, RN

## 2018-11-07 NOTE — TOC Initial Note (Signed)
Transition of Care [facility]) - Initial/Assessment Note    Patient Details  Name: Meagan Schmidt MRN: [id number] Date of Birth: 1986/03/27  Transition of Care [facility]) CM/SW Contact:    Bess Kinds, RN Phone Number: [phone] 11/07/2018, 2:31 PM  Clinical Narrative:                 PTA home with minor child. Received referral from provider about needed pregnancy resources including options for pregnancy termination. Spoke with someone at [employer] for [employer] who advised that only resource for termination of pregnancy was [facility]. Discussed with patient and contact information provided on AVS. Patient expressed no other needs. Patient to transition home today.   Expected Discharge Plan: Home/Self Care Barriers to Discharge: No Barriers Identified   Patient Goals and CMS Choice     Choice offered to / list presented to : NA  Expected Discharge Plan and Services Expected Discharge Plan: Home/Self Care In-house Referral: Clinical Social Work Discharge Planning Services: CM Consult Post Acute Care Choice: NA   Expected Discharge Date: 11/07/18               DME Arranged: N/A DME Agency: NA       HH Arranged: NA HH Agency: NA        Prior Living Arrangements/Services   Lives with:: Self, Minor Children Patient language and need for interpreter reviewed:: Yes              Criminal Activity/Legal Involvement Pertinent to Current Situation/Hospitalization: No - Comment as needed  Activities of Daily Living Home Assistive Devices/Equipment: None ADL Screening (condition at time of admission) Patient's cognitive ability adequate to safely complete daily activities?: Yes Is the patient deaf or have difficulty hearing?: No Does the patient have difficulty seeing, even when wearing glasses/contacts?: No Does the patient have difficulty concentrating, remembering, or making decisions?: No Patient able to express need for assistance with  ADLs?: No Does the patient have difficulty dressing or bathing?: No Independently performs ADLs?: Yes (appropriate for developmental age) Does the patient have difficulty walking or climbing stairs?: No Weakness of Legs: None Weakness of Arms/Hands: None  Permission Sought/Granted                  Emotional Assessment Appearance:: Appears stated age Attitude/Demeanor/Rapport: Engaged Affect (typically observed): Accepting Orientation: : Oriented to Self, Oriented to Place, Oriented to  Time, Oriented to Situation   Psych Involvement: No (comment)  Admission diagnosis:  Atypical chest pain [R07.89] Nausea and vomiting in pregnancy [O21.9] Intractable nausea and vomiting [R11.2] Patient Active Problem List   Diagnosis Date Noted  . Intractable nausea and vomiting 11/06/2018  . Hyperemesis gravidarum 11/06/2018  . Leukocytosis 11/06/2018  . Chest pain 11/06/2018  . Pregnancy 11/06/2018  . Hypotension 11/06/2018   PCP:  System, Pcp Not In Pharmacy:   [facility] [place], [place] - [address] [place] [address] [place] [address] Phone: [phone] Fax: [phone]     Social Determinants of Health (SDOH) Interventions    Readmission Risk Interventions No flowsheet data found.

## 2018-11-07 NOTE — Discharge Summary (Signed)
Physician Discharge Summary  Meagan Schmidt ZOX:[id number] DOB: 1986-01-31 DOA: 11/05/2018  PCP: System, Pcp Not In  Admit date: 11/05/2018 Discharge date: 11/07/2018  Time spent: 45 minutes  Recommendations for Outpatient Follow-up:  1. Follow up with OB/gyn for evaluation of symptoms and management of pregnancy 2. Take medications as prescribed   Discharge Diagnoses:  Principal Problem:   Intractable nausea and vomiting Active Problems:   Hyperemesis gravidarum   Leukocytosis   Chest pain   Pregnancy   Hypotension   Discharge Condition: stable  Diet recommendation: small frequent meals.   Filed Weights   11/05/18 2246 11/06/18 0845 11/06/18 2011  Weight: 59 kg 59.4 kg 59.4 kg    History of present illness:  Patient admitted 11/06/18 with intractable nausea and vomiting concerning for hyperemesis gravidarum. Patient is approximately [redacted] weeks pregnant. reports 2 week hx of not being able to keep food down. Reported gerd symptoms as well. No abdominal pain/cramping. Was seen in ED 5/4 and prescribed meds but had not gotten due to finances. Stated she wanted to terminate pregnancy. No fever, chills, cough, diarrhea dysuria frequency.  Hospital Course:  Intractable nausea and vomiting,suspect secondary tohyperemesis gravidarum. Patient approximately 6 weeks and 1 day pregnant. Presented with a 2-week history of intractable nausea and vomiting. White count 16.6 with left shift on admission. Lipase and LFTs normal. No significant electrolyte derangements. At discharge WBC within limits of normal.  Seen in the ED on May 4 and right upper quadrant ultrasound donedone at that timewithout evidence of gallstones, no gallbladder wall thickening, negative. provided with IV fluids, phenergan and pepcid. At discharge tolerating fluids well and small amounts of food. Will discharge with B6, unasyn and protonix. Case management provided resources for OB/Gyn follow up.    Hypotension: likely related to volume. Resolved at discharge.    Leukocytosis. Afebrile.  Resolved at discharge. White count 16.6 with left shift. Lungs clear on chest x-ray done a week pror. No s/sx infection.   Noncardiac chest pain. Related to vomiting. Resolved at discharge. Chest x-ray done a week prior without evidence of rib fracture.EKG not suggestive of ACS.  Pregnancy Approximately 6 weeks and 1 day pregnant. Patient has expressed that she wants to terminate this pregnancy as she has had complications during her prior pregnancy. List of resources provided.  Procedures:  Consultations:    Discharge Exam: Vitals:   11/07/18 0923 11/07/18 1226  BP: (!) 119/52 115/62  Pulse: 62 60  Resp: 17   Temp: 98.3 F (36.8 C) 98.2 F (36.8 C)  SpO2:  100%    General: awake alert no acute distress Cardiovascular: rrr no mgr no LE edema Respiratory: normal effort BS clear bilaterally no wheeze  Discharge Instructions   Discharge Instructions    Call MD for:  difficulty breathing, headache or visual disturbances   Complete by:  As directed    Call MD for:  persistant dizziness or light-headedness   Complete by:  As directed    Call MD for:  persistant nausea and vomiting   Complete by:  As directed    Call MD for:  severe uncontrolled pain   Complete by:  As directed    Call MD for:  temperature >100.4   Complete by:  As directed    Diet - low sodium heart healthy   Complete by:  As directed    Discharge instructions   Complete by:  As directed    Take medications as prescribed Follow up with OB/Gyn in 1-2  weeks for evaluation of symptoms   Increase activity slowly   Complete by:  As directed      Allergies as of 11/07/2018   No Known Allergies     Medication List    STOP taking these medications   ibuprofen 800 MG tablet Commonly known as:  ADVIL   Vicks VapoRub 4.7-1.2-2.6 % Oint     TAKE these medications   calcium carbonate 500 MG chewable  tablet Commonly known as:  Tums Chew 1 tablet (200 mg of elemental calcium total) by mouth 3 (three) times daily as needed for indigestion or heartburn.   doxylamine (Sleep) 25 MG tablet Commonly known as:  UNISOM Take 1 tablet (25 mg total) by mouth at bedtime.   Doxylamine-Pyridoxine 10-10 MG Tbec Take 1 tablet by mouth 3 (three) times daily as needed (nausea, vomiting).   famotidine 20 MG tablet Commonly known as:  PEPCID Take 1 tablet (20 mg total) by mouth 2 (two) times daily. What changed:  when to take this   pantoprazole 40 MG tablet Commonly known as:  PROTONIX Take 1 tablet (40 mg total) by mouth daily. Start taking on:  Nov 08, 2018   pyridOXINE 25 MG tablet Commonly known as:  VITAMIN B-6 Take 1 tablet (25 mg total) by mouth every 8 (eight) hours.      No Known Allergies    The results of significant diagnostics from this hospitalization (including imaging, microbiology, ancillary and laboratory) are listed below for reference.    Significant Diagnostic Studies: Dg Chest 1 View  Result Date: 10/30/2018 CLINICAL DATA:  Left-sided chest pain. EXAM: CHEST  1 VIEW COMPARISON:  Chest x-ray dated December 31, 2017. FINDINGS: The heart size and mediastinal contours are within normal limits. Both lungs are clear. The visualized skeletal structures are unremarkable. IMPRESSION: No active disease. Electronically Signed   By: Obie Dredge M.D.   On: 10/30/2018 15:05   US Ob Comp Less 14 Wks  Result Date: 10/30/2018 CLINICAL DATA:  Right upper quadrant pain. Patient with quantitative beta HCG greater than 2000. Evaluate for ectopic pregnancy. EXAM: OBSTETRIC <14 WK ULTRASOUND TECHNIQUE: Transabdominal ultrasound was performed for evaluation of the gestation as well as the maternal uterus and adnexal regions. Patient declined transvaginal imaging. COMPARISON:  None. FINDINGS: Intrauterine gestational sac: Single visualized. Yolk sac:  Not visualized. Embryo:  Not visualized.  Cardiac Activity: Not visualized. Heart Rate: Not visualized.  Bpm MSD:  6.8 mm   5 w   3 d Subchorionic hemorrhage:  None visualized. Maternal uterus/adnexae: Suggestion of split versus 2 separate endometrium which may be due to didelphys versus arcuate uterus. Ovaries normal size, shape and position with normal color Doppler. IMPRESSION: Intrauterine gestational sac without yolk sac or embryo. Mean sac diameter compatible with estimated gestational age [redacted] weeks 3 days. Findings likely represent a normal early pregnancy and less likely anembryonic pregnancy. Recommend correlation with serial quantitative beta HCG and follow-up ultrasound 2 weeks. Electronically Signed   By: Elberta Fortis M.D.   On: 10/30/2018 15:23   US Abdomen Limited Ruq  Result Date: 10/30/2018 CLINICAL DATA:  Right upper quadrant pain, nausea, vomiting and diarrhea for approximately 2 days. EXAM: ULTRASOUND ABDOMEN LIMITED RIGHT UPPER QUADRANT COMPARISON:  None. FINDINGS: Gallbladder: No gallstones or wall thickening visualized. No sonographic Murphy sign noted by sonographer. Common bile duct: Diameter: 0.4 cm Liver: No focal lesion identified. Within normal limits in parenchymal echogenicity. Portal vein is patent on color Doppler imaging with normal direction of blood  flow towards the liver. IMPRESSION: Normal exam.  Negative for gallstones. Electronically Signed   By: Drusilla Kanner M.D.   On: 10/30/2018 14:43    Microbiology: Recent Results (from the past 240 hour(s))  SARS Coronavirus 2 (CEPHEID - Performed in [facility] hospital lab), Hosp Order     Status: None   Collection Time: 11/06/18  5:00 AM  Result Value Ref Range Status   SARS Coronavirus 2 NEGATIVE NEGATIVE Final    Comment: (NOTE) If result is NEGATIVE SARS-CoV-2 target nucleic acids are NOT DETECTED. The SARS-CoV-2 RNA is generally detectable in upper and lower  respiratory specimens during the acute phase of infection. The lowest  concentration of  SARS-CoV-2 viral copies this assay can detect is 250  copies / mL. A negative result does not preclude SARS-CoV-2 infection  and should not be used as the sole basis for treatment or other  patient management decisions.  A negative result may occur with  improper specimen collection / handling, submission of specimen other  than nasopharyngeal swab, presence of viral mutation(s) within the  areas targeted by this assay, and inadequate number of viral copies  (<250 copies / mL). A negative result must be combined with clinical  observations, patient history, and epidemiological information. If result is POSITIVE SARS-CoV-2 target nucleic acids are DETECTED. The SARS-CoV-2 RNA is generally detectable in upper and lower  respiratory specimens dur ing the acute phase of infection.  Positive  results are indicative of active infection with SARS-CoV-2.  Clinical  correlation with patient history and other diagnostic information is  necessary to determine patient infection status.  Positive results do  not rule out bacterial infection or co-infection with other viruses. If result is PRESUMPTIVE POSTIVE SARS-CoV-2 nucleic acids MAY BE PRESENT.   A presumptive positive result was obtained on the submitted specimen  and confirmed on repeat testing.  While 2019 novel coronavirus  (SARS-CoV-2) nucleic acids may be present in the submitted sample  additional confirmatory testing may be necessary for epidemiological  and / or clinical management purposes  to differentiate between  SARS-CoV-2 and other Sarbecovirus currently known to infect humans.  If clinically indicated additional testing with an alternate test  methodology [phone]) is advised. The SARS-CoV-2 RNA is generally  detectable in upper and lower respiratory sp ecimens during the acute  phase of infection. The expected result is Negative. Fact Sheet for Patients:  BoilerBrush.com.cy Fact Sheet for Healthcare  Providers: https://pope.com/ This test is not yet approved or cleared by the [place] FDA and has been authorized for detection and/or diagnosis of SARS-CoV-2 by FDA under an Emergency Use Authorization (EUA).  This EUA will remain in effect (meaning this test can be used) for the duration of the COVID-19 declaration under Section 564(b)(1) of the Act, 21 U.S.C. section 360bbb-3(b)(1), unless the authorization is terminated or revoked sooner. Performed at [facility], [address], [place], [place] [address]      Labs: Basic Metabolic Panel: Recent Labs  Lab 11/06/18 0105 11/06/18 0612 11/07/18 0316  NA 137 136 135  K 3.7 3.6 3.6  CL 101 105 110  CO2 22 17* 20*  GLUCOSE 102* 93 89  BUN CREATININE 0.68 0.56 0.60  CALCIUM 9.4 8.0* 7.8*   Liver Function Tests: Recent Labs  Lab 11/06/18 0105  AST 27  ALT 18  ALKPHOS 51  BILITOT 0.7  PROT 7.4  ALBUMIN 4.4   Recent Labs  Lab 11/06/18 0105  LIPASE 22   No results for input(s): AMMONIA in the last 168 hours. CBC: Recent Labs  Lab 11/06/18 0105 11/06/18 0612 11/07/18 0316  WBC 16.6* 12.4* 10.0  NEUTROABS 13.8*  --   --   HGB 14.6 13.1 12.4  HCT 43.7 39.7 37.8  MCV 92.0 93.0 92.6  PLT 376 320 292   Cardiac Enzymes: No results for input(s): CKTOTAL, CKMB, CKMBINDEX, TROPONINI in the last 168 hours. BNP: BNP (last 3 results) No results for input(s): BNP in the last 8760 hours.  ProBNP (last 3 results) No results for input(s): PROBNP in the last 8760 hours.  CBG: No results for input(s): GLUCAP in the last 168 hours.     SignedGwenyth Bender:  Marsena Taff M NP  [facility] 11/07/2018, 1:35 PM

## 2018-11-15 ENCOUNTER — Emergency Department (HOSPITAL_COMMUNITY)
Admission: EM | Admit: 2018-11-15 | Discharge: 2018-11-15 | Disposition: A | Discharge status: Home / Self Care | Payer: Medicaid Other | Attending: Emergency Medicine | Admitting: Emergency Medicine

## 2018-11-15 ENCOUNTER — Other Ambulatory Visit: Payer: Self-pay

## 2018-11-15 ENCOUNTER — Encounter (HOSPITAL_COMMUNITY): Payer: Self-pay | Admitting: Emergency Medicine

## 2018-11-15 ENCOUNTER — Emergency Department (HOSPITAL_COMMUNITY): Payer: Medicaid Other

## 2018-11-15 DIAGNOSIS — R112 Nausea with vomiting, unspecified: Secondary | ICD-10-CM

## 2018-11-15 DIAGNOSIS — O2690 Pregnancy related conditions, unspecified, unspecified trimester: Secondary | ICD-10-CM | POA: Diagnosis not present

## 2018-11-15 DIAGNOSIS — R197 Diarrhea, unspecified: Secondary | ICD-10-CM | POA: Insufficient documentation

## 2018-11-15 DIAGNOSIS — Z87891 Personal history of nicotine dependence: Secondary | ICD-10-CM | POA: Diagnosis not present

## 2018-11-15 DIAGNOSIS — Z3A00 Weeks of gestation of pregnancy not specified: Secondary | ICD-10-CM | POA: Diagnosis not present

## 2018-11-15 DIAGNOSIS — Z79899 Other long term (current) drug therapy: Secondary | ICD-10-CM | POA: Diagnosis not present

## 2018-11-15 DIAGNOSIS — R8271 Bacteriuria: Secondary | ICD-10-CM

## 2018-11-15 DIAGNOSIS — R079 Chest pain, unspecified: Secondary | ICD-10-CM | POA: Diagnosis present

## 2018-11-15 DIAGNOSIS — R072 Precordial pain: Secondary | ICD-10-CM | POA: Diagnosis not present

## 2018-11-15 LAB — I-STAT BETA HCG BLOOD, ED (MC, WL, AP ONLY): I-stat hCG, quantitative: >2000 m[IU]/mL — ABNORMAL HIGH (ref <5)

## 2018-11-15 LAB — CBC
HCT: 47.8 % — ABNORMAL HIGH (ref 36.0–46.0)
Hemoglobin: 15.4 g/dL — ABNORMAL HIGH (ref 12.0–15.0)
MCH: 30.5 pg (ref 26.0–34.0)
MCHC: 32.2 g/dL (ref 30.0–36.0)
MCV: 94.7 fL (ref 80.0–100.0)
Platelets: 399 10*3/uL (ref 150–400)
RBC: 5.05 MIL/uL (ref 3.87–5.11)
RDW: 13 % (ref 11.5–15.5)
WBC: 14.1 10*3/uL — ABNORMAL HIGH (ref 4.0–10.5)
nRBC: 0 % (ref 0.0–0.2)

## 2018-11-15 LAB — HEPATIC FUNCTION PANEL
ALT: 32 U/L (ref 0–44)
AST: 28 U/L (ref 15–41)
Albumin: 4 g/dL (ref 3.5–5.0)
Alkaline Phosphatase: 46 U/L (ref 38–126)
Bilirubin, Direct: 0.1 mg/dL (ref 0.0–0.2)
Indirect Bilirubin: 0.5 mg/dL (ref 0.3–0.9)
Total Bilirubin: 0.6 mg/dL (ref 0.3–1.2)
Total Protein: 7.1 g/dL (ref 6.5–8.1)

## 2018-11-15 LAB — BASIC METABOLIC PANEL
Anion gap: 16 — ABNORMAL HIGH (ref 5–15)
BUN: 12 mg/dL (ref 6–20)
CO2: 18 mmol/L — ABNORMAL LOW (ref 22–32)
Calcium: 9.2 mg/dL (ref 8.9–10.3)
Chloride: 102 mmol/L (ref 98–111)
Creatinine, Ser: 0.64 mg/dL (ref 0.44–1.00)
GFR calc Af Amer: >60 mL/min (ref >60)
GFR calc non Af Amer: >60 mL/min (ref >60)
Glucose, Bld: 104 mg/dL — ABNORMAL HIGH (ref 70–99)
Potassium: 3.6 mmol/L (ref 3.5–5.1)
Sodium: 136 mmol/L (ref 135–145)

## 2018-11-15 LAB — RAPID URINE DRUG SCREEN, HOSP PERFORMED
Amphetamines: NOT DETECTED
Barbiturates: NOT DETECTED
Benzodiazepines: NOT DETECTED
Cocaine: NOT DETECTED
Opiates: NOT DETECTED
Tetrahydrocannabinol: POSITIVE — AB

## 2018-11-15 LAB — URINALYSIS, ROUTINE W REFLEX MICROSCOPIC
Bilirubin Urine: NEGATIVE
Glucose, UA: NEGATIVE mg/dL
Hgb urine dipstick: NEGATIVE
Ketones, ur: 20 mg/dL — AB
Leukocytes,Ua: NEGATIVE
Nitrite: NEGATIVE
Protein, ur: 30 mg/dL — AB
Specific Gravity, Urine: 1.031 — ABNORMAL HIGH (ref 1.005–1.030)
pH: 5 (ref 5.0–8.0)

## 2018-11-15 LAB — LIPASE, BLOOD: Lipase: 22 U/L (ref 11–51)

## 2018-11-15 LAB — TROPONIN I: Troponin I: <0.03 ng/mL (ref <0.03)

## 2018-11-15 MED ORDER — SODIUM CHLORIDE 0.9% FLUSH
3.0000 mL | Freq: Once | INTRAVENOUS | Status: AC
  Administered 2018-11-15: 3 mL via INTRAVENOUS

## 2018-11-15 MED ORDER — NITROFURANTOIN MONOHYD MACRO 100 MG PO CAPS: 100.0000 mg | ORAL_CAPSULE | Freq: Two times a day (BID) | ORAL | 0 refills | Status: AC

## 2018-11-15 MED ORDER — FAMOTIDINE IN NACL 20-0.9 MG/50ML-% IV SOLN
20.0000 mg | Freq: Once | INTRAVENOUS | Status: AC
  Administered 2018-11-15: 14:00:00 20 mg via INTRAVENOUS
  Filled 2018-11-15: qty 50

## 2018-11-15 MED ORDER — ACETAMINOPHEN 500 MG PO TABS
500.0000 mg | ORAL_TABLET | Freq: Once | ORAL | Status: AC
  Administered 2018-11-15: 500 mg via ORAL
  Filled 2018-11-15: qty 1

## 2018-11-15 MED ORDER — METOCLOPRAMIDE HCL 5 MG/ML IJ SOLN
10.0000 mg | Freq: Once | INTRAMUSCULAR | Status: AC
  Administered 2018-11-15: 10 mg via INTRAVENOUS
  Filled 2018-11-15: qty 2

## 2018-11-15 MED ORDER — SODIUM CHLORIDE 0.9 % IV BOLUS
1000.0000 mL | Freq: Once | INTRAVENOUS | Status: AC
  Administered 2018-11-15: 1000 mL via INTRAVENOUS

## 2018-11-15 MED ORDER — ONDANSETRON 4 MG PO TBDP: 4.0000 mg | ORAL_TABLET | Freq: Three times a day (TID) | ORAL | 0 refills | Status: DC | PRN

## 2018-11-15 MED ORDER — DIPHENHYDRAMINE HCL 50 MG/ML IJ SOLN
25.0000 mg | Freq: Once | INTRAMUSCULAR | Status: AC
  Administered 2018-11-15: 25 mg via INTRAVENOUS
  Filled 2018-11-15: qty 1

## 2018-11-15 MED ORDER — ONDANSETRON 4 MG PO TBDP
4.0000 mg | ORAL_TABLET | Freq: Once | ORAL | Status: AC | PRN
  Administered 2018-11-15: 4 mg via ORAL
  Filled 2018-11-15: qty 1

## 2018-11-15 NOTE — ED Notes (Signed)
Pt appropriate for dc.  States her mom has her Medicaid card in [place].  Instructed her to call her mom and get her card.  Number given for [facility] for follow up.

## 2018-11-15 NOTE — ED Triage Notes (Signed)
Patient reports recurrent L-sided chest pain that worsened yesterday - was admitted for same a few weeks ago - told her chest was swollen. She reports it was starting to get better but pain severe yesterday and today with multiple episodes of N/V, dry heaving. Patient tearful in triage. Resp e/u, skin w/d. Patient also pregnant, but doesn't know how far along.

## 2018-11-15 NOTE — Discharge Instructions (Addendum)
You were seen in the emergency department for nausea, vomiting, diarrhea, chest pain.  Work-up today in the emergency department was overall reassuring.  He had bacteria in your urine.  During pregnancy, we treat the bacteria in the urine with antibiotics.  Take Macrobid as prescribed.  Marijuana use may be contributing to bouts of nausea, vomiting.  Consider stopping this especially during pregnancy.  Use Zofran under the tongue every 8 hours for the next 48 hours to prevent vomiting.  Stay well-hydrated.  You can take Tylenol as needed for pain in the chest.  Continue using the cream on your chest to help with pain as well.  Return to the ED for fever greater than 100, blood in your vomit or stool, persistent abdominal pain, chest pain or shortness of breath with exertion or activity.

## 2018-11-15 NOTE — TOC Transition Note (Signed)
Transition of Care [facility]) - CM/SW Discharge Note   Patient Details  Name: Meagan Schmidt MRN: [id number] Date of Birth: 1986-03-22  [facility]) CM/SW Contact:  Fuller Mandril, RN Phone Number: 11/15/2018, 3:44 PM   Clinical Narrative:     [facility] consulted regarding medication assistance for insured pt.  Final next level of care: Home/Self Care Barriers to Discharge: Barriers Resolved   Patient Goals and CMS Choice Patient states their goals for this hospitalization and ongoing recovery are:: stop this chest pain and afford my medication      Discharge Placement                       Discharge Plan and Services   Discharge Planning Services: CM Consult Post Acute Care Choice: NA                 [facility] met with pt at bedside to discuss medication needs.  Pt has Medicaid that yeilds a $3 co-pay for most medications.  When [facility] asked if her Medicaid was still active, pt replied that it is and she just doesn't have the card.  EDCM advised her to reach out to DSS to obtain a replacement card for future needs.  Pt inquired about terminating her pregnancy.  EDCM suggested [facility] speak with [facility]  For guidance in this matter.  No further EDCM needs identified at this time.              Social Determinants of Health (SDOH) Interventions     Readmission Risk Interventions No flowsheet data found.

## 2018-11-15 NOTE — ED Provider Notes (Signed)
[facility] EMERGENCY DEPARTMENT Provider Note   CSN: [id number] Arrival date & time: 11/15/18  1222    History   Chief Complaint Chief Complaint  Patient presents with  . Chest Pain    HPI Ersel Wadleigh is a 33 y.o. female with h/o GERD, currently pregnant, recent admission for hyperemesis gravidarum presents to ED for evaluation of "chest pain" associated with nausea and multiple nbnb emesis, non bloody non melenotic diarrhea 4-5x today. Emesis makes her break out in chills and sweats and SOB.  CP is located to left upper chest above chest described as sharp, burning, stabbing, constant onset since yesterday morning as soon as she woke up.  Worse with moving, palpation, cough, talking.  Chest pain intensifies with active vomiting "burning", but actually improves right after emesis.  Vomiting "releases the pain".  Has taken nyquil without relief. At last hospital dc was discharged with topical cream that she applied, provided mild transient pain relief.  States symptoms feel similar to last time she had to get admitted to the hospital.  Couldn't get nausea or acid reflux medicine after hospital dc because she doesn't have money for it.  [facility], denies vaginal bleeding or leaking. LMP 3/21.  Last marijuana use 10 days ago. Denies ibuprofen use. Denies heavy ETOH use. Denies h/o gastritis, PUD.  Denies fever, sore throat, cough, exertional CP or SOB, hematemesis, melena, hematochezia, dysuria. No sick contacts. No recent travel. No known exposure to suspected or confirmed COVID.  No back pain, distal numbness or weakness. No h/o DVT/PE, hormone therapy, recent surgery or prolonged immobilization, leg swelling or pain.     HPI  History reviewed. No pertinent past medical history.  Patient Active Problem List   Diagnosis Date Noted  . Intractable nausea and vomiting 11/06/2018  . Hyperemesis gravidarum  11/06/2018  . Leukocytosis 11/06/2018  . Chest pain 11/06/2018  . Pregnancy 11/06/2018  . Hypotension 11/06/2018    History reviewed. No pertinent surgical history.   OB History    Gravida  1   Para      Term      Preterm      AB      Living        SAB      TAB      Ectopic      Multiple      Live Births               Home Medications    Prior to Admission medications   Medication Sig Start Date End Date Taking? Authorizing Provider  acetaminophen (TYLENOL) 325 MG tablet Take 650 mg by mouth every 6 (six) hours as needed for mild pain or headache.   Yes [provider]  calcium carbonate (TUMS) 500 MG chewable tablet Chew 1 tablet (200 mg of elemental calcium total) by mouth 3 (three) times daily as needed for indigestion or heartburn. Patient not taking: Reported on 11/15/2018 10/30/18   Shaune Pollack, MD  doxylamine, Sleep, (UNISOM) 25 MG tablet Take 1 tablet (25 mg total) by mouth at bedtime. Patient not taking: Reported on 11/15/2018 11/07/18   Gwenyth Bender, NP  Doxylamine-Pyridoxine 10-10 MG TBEC Take 1 tablet by mouth 3 (three) times daily as needed (nausea, vomiting). Patient not taking: Reported on 11/05/2018 10/30/18   Shaune Pollack, MD  famotidine (PEPCID) 20 MG tablet Take 1 tablet (20 mg total) by mouth 2 (two) times daily. Patient  not taking: Reported on 11/15/2018 11/07/18   Gwenyth Bender, NP  nitrofurantoin, macrocrystal-monohydrate, (MACROBID) 100 MG capsule Take 1 capsule (100 mg total) by mouth 2 (two) times daily. 11/15/18   Liberty Handy, PA-C  ondansetron (ZOFRAN ODT) 4 MG disintegrating tablet Take 1 tablet (4 mg total) by mouth every 8 (eight) hours as needed for nausea or vomiting. 11/15/18   Liberty Handy, PA-C  pantoprazole (PROTONIX) 40 MG tablet Take 1 tablet (40 mg total) by mouth daily. Patient not taking: Reported on 11/15/2018 11/08/18   Gwenyth Bender, NP  vitamin B-6 (VITAMIN B-6) 25 MG tablet Take 1 tablet (25 mg  total) by mouth every 8 (eight) hours. Patient not taking: Reported on 11/15/2018 11/07/18   Gwenyth Bender, NP    Family History No family history on file.  Social History Social History   Tobacco Use  . Smoking status: Former Smoker    Types: Cigarettes  . Smokeless tobacco: Never Used  Substance Use Topics  . Alcohol use: Not Currently  . Drug use: Yes    Types: Marijuana     Allergies   Patient has no known allergies.   Review of Systems Review of Systems  Constitutional: Positive for chills and diaphoresis.  Respiratory: Positive for shortness of breath.   Cardiovascular: Positive for chest pain.  Gastrointestinal: Positive for abdominal pain, diarrhea, nausea and vomiting.  All other systems reviewed and are negative.    Physical Exam Updated Vital Signs BP 114/75 (BP Location: Left Arm)   Pulse 71   Temp 98.3 F (36.8 C) (Oral)   Resp 13   Ht  (1.549 m)   Wt 59 kg   LMP 09/16/2018   SpO2 100%   BMI 24.56 kg/m   Physical Exam Vitals signs and nursing note reviewed.  Constitutional:      Appearance: She is well-developed.     Comments: Teary eyed, non toxic. Rocking in bed in fetal position.   HENT:     Head: Normocephalic and atraumatic.     Nose: Nose normal.     Mouth/Throat:     Comments: MMM Eyes:     Conjunctiva/sclera: Conjunctivae normal.  Neck:     Musculoskeletal: Normal range of motion.  Cardiovascular:     Rate and Rhythm: Normal rate and regular rhythm.     Pulses:          Radial pulses are 1+ on the right side and 1+ on the left side.       Dorsalis pedis pulses are 1+ on the right side and 1+ on the left side.     Comments: No LE edema. No calf tenderness. Pulmonary:     Effort: Pulmonary effort is normal.     Breath sounds: Normal breath sounds.  Chest:     Chest wall: Tenderness present.       Comments: Tenderness to left upper chest wall. No overlaying skin abnormalities. Pain with movement noted Abdominal:      General: Bowel sounds are normal.     Palpations: Abdomen is soft.     Tenderness: There is no abdominal tenderness.     Comments: No epigastric RUQ tenderness. No G/R/R. No suprapubic or CVA tenderness. Negative Murphy's and McBurney's. Active BS to lower quadrants.   Musculoskeletal: Normal range of motion.  Skin:    General: Skin is warm and dry.     Capillary Refill: Capillary refill takes less than 2 seconds.  Comments: Sensation and strength intact in upper and lower extremities  Neurological:     Mental Status: She is alert.  Psychiatric:        Behavior: Behavior normal.      ED Treatments / Results  Labs (all labs ordered are listed, but only abnormal results are displayed) Labs Reviewed  BASIC METABOLIC PANEL - Abnormal; Notable for the following components:      Result Value   CO2 18 (*)    Glucose, Bld 104 (*)    Anion gap 16 (*)    All other components within normal limits  CBC - Abnormal; Notable for the following components:   WBC 14.1 (*)    Hemoglobin 15.4 (*)    HCT 47.8 (*)    All other components within normal limits  RAPID URINE DRUG SCREEN, HOSP PERFORMED - Abnormal; Notable for the following components:   Tetrahydrocannabinol POSITIVE (*)    All other components within normal limits  URINALYSIS, ROUTINE W REFLEX MICROSCOPIC - Abnormal; Notable for the following components:   Color, Urine AMBER (*)    APPearance HAZY (*)    Specific Gravity, Urine 1.031 (*)    Ketones, ur 20 (*)    Protein, ur 30 (*)    Bacteria, UA FEW (*)    All other components within normal limits  I-STAT BETA HCG BLOOD, ED ([facility], [facility], [facility] ONLY) - Abnormal; Notable for the following components:   I-stat hCG, quantitative >2,000.0 (*)    All other components within normal limits  TROPONIN I  HEPATIC FUNCTION PANEL  LIPASE, BLOOD    EKG EKG Interpretation  Date/Time:  Wednesday Nov 15 2018 12:41:16 EDT Ventricular Rate:  91 PR Interval:  128 QRS Duration: 68 QT  Interval:  368 QTC Calculation: 452 R Axis:   74 Text Interpretation:  Normal sinus rhythm Normal ECG similar to prior 5/20 Confirmed by Meridee Score [phone]) on 11/15/2018 1:38:14 PM   Radiology Dg Chest 2 View  Result Date: 11/15/2018 CLINICAL DATA:  Chest pain and vomiting EXAM: CHEST - 2 VIEW COMPARISON:  Oct 30, 2018 FINDINGS: Lungs are clear. Heart size and pulmonary vascularity are normal. No adenopathy. No bone lesions. IMPRESSION: No edema or consolidation. Electronically Signed   By: Bretta Bang III M.D.   On: 11/15/2018 13:34    Procedures Procedures (including critical care time)  Medications Ordered in ED Medications  sodium chloride flush (NS) 0.9 % injection 3 mL (3 mLs Intravenous Given 11/15/18 1425)  ondansetron (ZOFRAN-ODT) disintegrating tablet 4 mg (4 mg Oral Given 11/15/18 1253)  sodium chloride 0.9 % bolus 1,000 mL (0 mLs Intravenous Stopped 11/15/18 1603)  metoCLOPramide (REGLAN) injection 10 mg (10 mg Intravenous Given 11/15/18 1421)  famotidine (PEPCID) IVPB 20 mg premix (0 mg Intravenous Stopped 11/15/18 1536)  diphenhydrAMINE (BENADRYL) injection 25 mg (25 mg Intravenous Given 11/15/18 1424)  acetaminophen (TYLENOL) tablet 500 mg (500 mg Oral Given 11/15/18 1541)     Initial Impression / Assessment and Plan / ED Course  I have reviewed the triage vital signs and the nursing notes.  Pertinent labs & imaging results that were available during my care of the patient were reviewed by me and considered in my medical decision making (see chart for details).  Clinical Course as of Nov 15 720  Wed Nov 15, 2018  1511 Re-evaluated patient. She is asleep. No emesis since initial evaluation.     [CG]  1529 Tetrahydrocannabinol(!): POSITIVE [CG]  1529 Ketones, ur(!): 20 [CG]  Thu Nov 16, 2018  0716 Anion gap(!): 16 [CG]  0717 WBC(!): 14.1 [CG]  0717 Hemoglobin(!): 15.4 [CG]  0717 HCT(!): 47.8 [CG]  0717 Ketones, ur(!): 20 [CG]  0717 Bacteria, UA(!): FEW [CG]     Clinical Course User Index [CG] Liberty HandyGibbons, Destry Bezdek J, PA-C      89 pregnant yo F here for CP, nausea, vomiting x 2 days. Chart review shows this is patient's 4th ED visit in the last 20 days for chest pain, nausea and vomiting. States this feels similar to previous episodes. No OB complaints today. Chronicity of symptoms, age, symptomatology is atypical for ACS, PE, dissection, PTX. She has reproducible tenderness to chest wall.  Doubt PNA, COVID, esophageal rupture.  No abd tenderness on exam today and intraabdominal pathology like cholecystitis, biliary colic, pancreatitis is unlikely.  I have reviewed patient's most recent admission.  RUQ US and CXR 5/4 unremarkable. OB US 5/4 confirms intrauterine gestational sac w/o yolk sac or embryo. She is supposed to f/u with OB in 2 weeks for repeat US to confirm pregnancy, but plans on terminating pregnancy.    Work up initiated at triage, reviewed. Remarkable as above. WBC without fever, tachycardia, infectious symptoms likely acute phase reactant vs dehydration. Hemoconcentration, ketonuria reflect dehydration.  No electrolyte abnormalities.   Final Clinical Impressions(s) / ED Diagnoses   Pt received one round of antiemetics, pepvid, and IVF. Symptoms improved. Some dry heaving but more emesis after antiemetics. She tolerated tylenol orally.  Unclear etiology of chronic CP, n/v but suspect THC, GERD, pregnancy all contributing.  Also a level of med non compliance not helping. CM consulted to assist with medication refill at discharge, she is to pick up her Medicaid from her mother. [facility] Dc with zofran ODT and macrobid for asymptomatic bacteruria. Encouraged THC cessation. Return precautions given.  Final diagnoses:  Precordial pain  Nausea vomiting and diarrhea  Bacteriuria    ED Discharge Orders         Ordered    ondansetron (ZOFRAN ODT) 4 MG disintegrating tablet  Every 8 hours PRN     11/15/18 1544     nitrofurantoin, macrocrystal-monohydrate, (MACROBID) 100 MG capsule  2 times daily     11/15/18 1544           Jerrell MylarGibbons, Maryan Sivak J, PA-C 11/16/18 [id number]    Terrilee FilesButler, Michael C, MD 11/16/18 [phone]

## 2018-11-15 NOTE — ED Notes (Signed)
Pt has been dramatic reporting her sx, with dry heaves and repeating that her head hurts. Now is sleeping without distress.

## 2018-11-16 ENCOUNTER — Other Ambulatory Visit: Payer: Self-pay

## 2018-11-16 ENCOUNTER — Encounter (HOSPITAL_COMMUNITY): Payer: Self-pay | Admitting: *Deleted

## 2018-11-16 ENCOUNTER — Inpatient Hospital Stay (HOSPITAL_COMMUNITY): Payer: Medicaid Other

## 2018-11-16 ENCOUNTER — Inpatient Hospital Stay (HOSPITAL_COMMUNITY)
Admission: AD | Admit: 2018-11-16 | Discharge: 2018-11-16 | Disposition: A | Discharge status: Home / Self Care | Payer: Medicaid Other | Attending: Obstetrics and Gynecology | Admitting: Obstetrics and Gynecology

## 2018-11-16 DIAGNOSIS — O99281 Endocrine, nutritional and metabolic diseases complicating pregnancy, first trimester: Secondary | ICD-10-CM | POA: Diagnosis not present

## 2018-11-16 DIAGNOSIS — O219 Vomiting of pregnancy, unspecified: Secondary | ICD-10-CM

## 2018-11-16 DIAGNOSIS — O99341 Other mental disorders complicating pregnancy, first trimester: Secondary | ICD-10-CM | POA: Diagnosis not present

## 2018-11-16 DIAGNOSIS — Z79899 Other long term (current) drug therapy: Secondary | ICD-10-CM | POA: Insufficient documentation

## 2018-11-16 DIAGNOSIS — F209 Schizophrenia, unspecified: Secondary | ICD-10-CM | POA: Insufficient documentation

## 2018-11-16 DIAGNOSIS — Z3A08 8 weeks gestation of pregnancy: Secondary | ICD-10-CM

## 2018-11-16 DIAGNOSIS — O209 Hemorrhage in early pregnancy, unspecified: Secondary | ICD-10-CM | POA: Diagnosis not present

## 2018-11-16 DIAGNOSIS — F319 Bipolar disorder, unspecified: Secondary | ICD-10-CM | POA: Insufficient documentation

## 2018-11-16 DIAGNOSIS — K2100 Gastro-esophageal reflux disease with esophagitis, without bleeding: Secondary | ICD-10-CM

## 2018-11-16 DIAGNOSIS — Z87891 Personal history of nicotine dependence: Secondary | ICD-10-CM | POA: Insufficient documentation

## 2018-11-16 DIAGNOSIS — N644 Mastodynia: Secondary | ICD-10-CM

## 2018-11-16 DIAGNOSIS — Z3491 Encounter for supervision of normal pregnancy, unspecified, first trimester: Secondary | ICD-10-CM

## 2018-11-16 HISTORY — DX: Schizophrenia, unspecified: F20.9

## 2018-11-16 HISTORY — DX: Headache, unspecified: R51.9

## 2018-11-16 HISTORY — DX: Bipolar disorder, unspecified: F31.9

## 2018-11-16 HISTORY — DX: Depression, unspecified: F32.A

## 2018-11-16 LAB — CBC
HCT: 43.6 % (ref 36.0–46.0)
Hemoglobin: 14.9 g/dL (ref 12.0–15.0)
MCH: 31 pg (ref 26.0–34.0)
MCHC: 34.2 g/dL (ref 30.0–36.0)
MCV: 90.6 fL (ref 80.0–100.0)
Platelets: 385 10*3/uL (ref 150–400)
RBC: 4.81 MIL/uL (ref 3.87–5.11)
RDW: 12.6 % (ref 11.5–15.5)
WBC: 11.5 10*3/uL — ABNORMAL HIGH (ref 4.0–10.5)
nRBC: 0 % (ref 0.0–0.2)

## 2018-11-16 LAB — URINALYSIS, ROUTINE W REFLEX MICROSCOPIC
Glucose, UA: NEGATIVE mg/dL
Ketones, ur: >80 mg/dL — AB
Nitrite: NEGATIVE
Protein, ur: 30 mg/dL — AB
Specific Gravity, Urine: >1.03 — ABNORMAL HIGH (ref 1.005–1.030)
pH: 5.5 (ref 5.0–8.0)

## 2018-11-16 LAB — URINALYSIS, MICROSCOPIC (REFLEX): Squamous Epithelial / HPF: >50 (ref 0–5)

## 2018-11-16 LAB — WET PREP, GENITAL
Sperm: NONE SEEN
Trich, Wet Prep: NONE SEEN
Yeast Wet Prep HPF POC: NONE SEEN

## 2018-11-16 LAB — HCG, QUANTITATIVE, PREGNANCY: hCG, Beta Chain, Quant, S: 45091 m[IU]/mL — ABNORMAL HIGH (ref <5)

## 2018-11-16 MED ORDER — METOCLOPRAMIDE HCL 5 MG/ML IJ SOLN
10.0000 mg | Freq: Once | INTRAMUSCULAR | Status: AC
  Administered 2018-11-16: 10 mg via INTRAVENOUS
  Filled 2018-11-16: qty 2

## 2018-11-16 MED ORDER — PROMETHAZINE HCL 25 MG PO TABS: 12.5000 mg | ORAL_TABLET | Freq: Four times a day (QID) | ORAL | 5 refills | Status: AC | PRN

## 2018-11-16 MED ORDER — FAMOTIDINE IN NACL 20-0.9 MG/50ML-% IV SOLN
20.0000 mg | Freq: Once | INTRAVENOUS | Status: AC
  Administered 2018-11-16: 21:00:00 20 mg via INTRAVENOUS
  Filled 2018-11-16: qty 50

## 2018-11-16 MED ORDER — LACTATED RINGERS IV BOLUS
1000.0000 mL | Freq: Once | INTRAVENOUS | Status: AC
  Administered 2018-11-16: 21:00:00 1000 mL via INTRAVENOUS

## 2018-11-16 MED ORDER — PANTOPRAZOLE SODIUM 40 MG PO TBEC: 40.0000 mg | DELAYED_RELEASE_TABLET | Freq: Every day | ORAL | 1 refills | Status: AC

## 2018-11-16 MED ORDER — DIPHENHYDRAMINE HCL 50 MG/ML IJ SOLN
25.0000 mg | Freq: Once | INTRAMUSCULAR | Status: AC
  Administered 2018-11-16: 25 mg via INTRAVENOUS
  Filled 2018-11-16: qty 1

## 2018-11-16 MED ORDER — DEXAMETHASONE SODIUM PHOSPHATE 10 MG/ML IJ SOLN
10.0000 mg | Freq: Once | INTRAMUSCULAR | Status: AC
  Administered 2018-11-16: 10 mg via INTRAVENOUS
  Filled 2018-11-16: qty 1

## 2018-11-16 MED ORDER — PROMETHAZINE HCL 25 MG PO TABS: 12.5000 mg | ORAL_TABLET | Freq: Four times a day (QID) | ORAL | 5 refills | Status: DC | PRN

## 2018-11-16 MED ORDER — LACTATED RINGERS IV BOLUS (SEPSIS)
1000.0000 mL | Freq: Once | INTRAVENOUS | Status: AC
  Administered 2018-11-16: 1000 mL via INTRAVENOUS

## 2018-11-16 MED ORDER — PROMETHAZINE HCL 25 MG/ML IJ SOLN
12.5000 mg | Freq: Once | INTRAMUSCULAR | Status: AC
  Administered 2018-11-16: 21:00:00 12.5 mg via INTRAVENOUS
  Filled 2018-11-16: qty 1

## 2018-11-16 MED ORDER — ACETAMINOPHEN 500 MG PO TABS
1000.0000 mg | ORAL_TABLET | Freq: Once | ORAL | Status: AC
  Administered 2018-11-16: 1000 mg via ORAL
  Filled 2018-11-16: qty 2

## 2018-11-16 NOTE — MAU Note (Signed)
Pt reports she has been having N/V for several weeks( After she found out she was pregnant). C/O abd and pain in her epigastric area from vomiting. C/O migraine as well. Took tylenol without relief. Was unable to get her nausea medication.

## 2018-11-16 NOTE — MAU Provider Note (Signed)
Chief Complaint: Emesis   First Provider Initiated Contact with Patient 11/16/18 1949      SUBJECTIVE HPI: Meagan Schmidt is a 33 y.o. G2P0101 at [redacted]w[redacted]d by LMP who presents to maternity admissions reporting n/v x 3-4 weeks, burning epigastric/chest pain and pain in her breasts/upper chest x 3-4 weeks, pain in her abdominal cramping x 3-4 days and vaginal bleeding with onset today. She also reports migraine with constant pain on the left side of her head with light sensitivity. Abdominal cramping is in her upper abdomen, under her ribs, intermittent pain that does not radiate.  There is also pain in both breasts, worse in left breast with significant tenderness to touch.  Her vaginal bleeding is red, light, requiring a pantyliner only and started today.   Recent medical hx includes ED visit on 5/4 with evaluation for chest pain with benign findings, and treatment for GERD and n/v of pregnancy. [place] on 5/4 with gestational sac only, no yolk sac.  She was to follow up with [facility] for quant hcg on 5/6 but called the office and it was closed so she never had labwork.  She returned to ED on 5/10 and again on 5/11 when she was admitted to hospitalists for similar symptoms.  She has not picked up Unisom or B6 which were prescribed due to cost.  She has not tried any treatments and there are no other reported symptoms.   HPI  Past Medical History:  Diagnosis Date  . Bipolar disorder (HCC)   . Depression   . Headache    Migraines  . Schizophrenia [facility])    Past Surgical History:  Procedure Laterality Date  . NO PAST SURGERIES     Social History   Socioeconomic History  . Marital status: Single    Spouse name: Not on file  . Number of children: Not on file  . Years of education: Not on file  . Highest education level: Not on file  Occupational History  . Not on file  Social Needs  . Financial resource strain: Not on file  . Food insecurity:    Worry: Not on file     Inability: Not on file  . Transportation needs:    Medical: Not on file    Non-medical: Not on file  Tobacco Use  . Smoking status: Former Smoker    Types: Cigarettes  . Smokeless tobacco: Never Used  Substance and Sexual Activity  . Alcohol use: Not Currently  . Drug use: Yes    Types: Marijuana    Comment: last smoked in April 2020  . Sexual activity: Not on file  Lifestyle  . Physical activity:    Days per week: Not on file    Minutes per session: Not on file  . Stress: Not on file  Relationships  . Social connections:    Talks on phone: Not on file    Gets together: Not on file    Attends religious service: Not on file    Active member of club or organization: Not on file    Attends meetings of clubs or organizations: Not on file    Relationship status: Not on file  . Intimate partner violence:    Fear of current or ex partner: Not on file    Emotionally abused: Not on file    Physically abused: Not on file    Forced sexual activity: Not on file  Other Topics Concern  . Not on file  Social History Narrative  .  Not on file   No current facility-administered medications on file prior to encounter.    Current Outpatient Medications on File Prior to Encounter  Medication Sig Dispense Refill  . acetaminophen (TYLENOL) 325 MG tablet Take 650 mg by mouth every 6 (six) hours as needed for mild pain or headache.    . calcium carbonate (TUMS) 500 MG chewable tablet Chew 1 tablet (200 mg of elemental calcium total) by mouth 3 (three) times daily as needed for indigestion or heartburn. (Patient not taking: Reported on 11/15/2018) 30 tablet 0  . doxylamine, Sleep, (UNISOM) 25 MG tablet Take 1 tablet (25 mg total) by mouth at bedtime. (Patient not taking: Reported on 11/15/2018) 30 tablet 0  . Doxylamine-Pyridoxine 10-10 MG TBEC Take 1 tablet by mouth 3 (three) times daily as needed (nausea, vomiting). (Patient not taking: Reported on 11/05/2018) 15 tablet 0  . famotidine (PEPCID) 20  MG tablet Take 1 tablet (20 mg total) by mouth 2 (two) times daily. (Patient not taking: Reported on 11/15/2018) 60 tablet 1  . nitrofurantoin, macrocrystal-monohydrate, (MACROBID) 100 MG capsule Take 1 capsule (100 mg total) by mouth 2 (two) times daily. 10 capsule 0  . ondansetron (ZOFRAN ODT) 4 MG disintegrating tablet Take 1 tablet (4 mg total) by mouth every 8 (eight) hours as needed for nausea or vomiting. 20 tablet 0  . vitamin B-6 (VITAMIN B-6) 25 MG tablet Take 1 tablet (25 mg total) by mouth every 8 (eight) hours. (Patient not taking: Reported on 11/15/2018) 90 tablet 1   No Known Allergies  ROS:  Review of Systems  Constitutional: Negative for chills, fatigue and fever.  HENT: Negative for sinus pressure.   Eyes: Negative for photophobia.  Respiratory: Negative for shortness of breath.   Cardiovascular: Positive for chest pain.  Gastrointestinal: Positive for abdominal pain, nausea and vomiting. Negative for constipation and diarrhea.  Genitourinary: Negative for difficulty urinating, dysuria, flank pain, frequency, pelvic pain, vaginal bleeding, vaginal discharge and vaginal pain.  Musculoskeletal: Negative for neck pain.  Neurological: Negative for dizziness, weakness and headaches.  Psychiatric/Behavioral: Negative.      I have reviewed patient's Past Medical Hx, Surgical Hx, Family Hx, Social Hx, medications and allergies.   Physical Exam   Patient Vitals for the past 24 hrs:  BP Temp Temp src Pulse Height Weight  11/16/18 2004 - 98.1 F (36.7 C) Axillary - - -  11/16/18 1848 115/63 - - 74 - -  11/16/18 1831 (!) 156/62 - - 71 - -  11/16/18 1750 - - - 99 5\' 1"  (1.549 m) 60.3 kg   Constitutional: Well-developed, well-nourished female in no acute distress.  Cardiovascular: normal rate Respiratory: normal effort GI: Abd soft, non-tender. Pos BS x 4 MS: Extremities nontender, no edema, normal ROM Neurologic: Alert and oriented x 4.  GU: Neg CVAT.  PELVIC EXAM: Cervix  pink, visually closed, without lesion, light red bleeding not requiring fox swab for visualization of cervix, vaginal walls and external genitalia normal   LAB RESULTS Results for orders placed or performed during the hospital encounter of 11/16/18 (from the past 24 hour(s))  Urinalysis, Routine w reflex microscopic     Status: Abnormal   Collection Time: 11/16/18  7:34 PM  Result Value Ref Range   Color, Urine YELLOW YELLOW   APPearance CLOUDY (A) CLEAR   Specific Gravity, Urine >1.030 (H) 1.005 - 1.030   pH 5.5 5.0 - 8.0   Glucose, UA NEGATIVE NEGATIVE mg/dL   Hgb urine dipstick LARGE (A)  NEGATIVE   Bilirubin Urine SMALL (A) NEGATIVE   Ketones, ur >80 (A) NEGATIVE mg/dL   Protein, ur 30 (A) NEGATIVE mg/dL   Nitrite NEGATIVE NEGATIVE   Leukocytes,Ua SMALL (A) NEGATIVE  CBC     Status: Abnormal   Collection Time: 11/16/18  7:34 PM  Result Value Ref Range   WBC 11.5 (H) 4.0 - 10.5 K/uL   RBC 4.81 3.87 - 5.11 MIL/uL   Hemoglobin 14.9 12.0 - 15.0 g/dL   HCT 16.143.6 09.636.0 - 04.546.0 %   MCV 90.6 80.0 - 100.0 fL   MCH 31.0 26.0 - 34.0 pg   MCHC 34.2 30.0 - 36.0 g/dL   RDW 40.912.6 81.111.5 - 91.415.5 %   Platelets 385 150 - 400 K/uL   nRBC 0.0 0.0 - 0.2 %  hCG, quantitative, pregnancy     Status: Abnormal   Collection Time: 11/16/18  7:34 PM  Result Value Ref Range   hCG, Beta Chain, Quant, S 45,091 (H) <5 mIU/mL  ABO/Rh     Status: None   Collection Time: 11/16/18  7:34 PM  Result Value Ref Range   ABO/RH(D)      O POS Performed at [facility], [address] [place], [place] [address]   Urinalysis, Microscopic (reflex)     Status: Abnormal   Collection Time: 11/16/18  7:34 PM  Result Value Ref Range   RBC / HPF 0-5 0 - 5 RBC/hpf   WBC, UA 11-20 0 - 5 WBC/hpf   Bacteria, UA MANY (A) NONE SEEN   Squamous Epithelial / LPF >50 0 - 5   Mucus PRESENT    Urine-Other LESS THAN 10 mL OF URINE SUBMITTED   Wet prep, genital     Status: Abnormal   Collection Time: 11/16/18  8:12 PM  Result  Value Ref Range   Yeast Wet Prep HPF POC NONE SEEN NONE SEEN   Trich, Wet Prep NONE SEEN NONE SEEN   Clue Cells Wet Prep HPF POC PRESENT (A) NONE SEEN   WBC, Wet Prep HPF POC MANY (A) NONE SEEN   Sperm NONE SEEN     --/--/O POS Performed at [facility], [address] [place], [place] [address]  [phone](05/21 1934)  IMAGING  [place] Ob Comp Less 14 Wks  Result Date: 11/16/2018 CLINICAL DATA:  Initial evaluation for acute abdominal pain, nausea, vomiting, vaginal bleeding. Early pregnancy. EXAM: OBSTETRIC <14 WK ULTRASOUND TECHNIQUE: Transabdominal ultrasound was performed for evaluation of the gestation as well as the maternal uterus and adnexal regions. COMPARISON:  Prior ultrasound from 10/30/2018. FINDINGS: Intrauterine gestational sac: Single Yolk sac:  Present Embryo:  Present Cardiac Activity: Present Heart Rate: 135 bpm CRL: 11.7 mm 7 w 2 d                  US EDC: 07/03/2019 Subchorionic hemorrhage:  None visualized. Maternal uterus/adnexae: Left ovary normal in appearance. Small corpus luteal cyst noted within the right ovary measuring 1.7 x 1.5 x 1.3 cm. 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 Pregnancy positioned within the right uterine horn. Decidual reaction noted within the left horn. IMPRESSION: 1. Single viable intrauterine pregnancy as above, estimated gestational age [redacted] weeks and 2 days by crown-rump length, with ultrasound EDC of 07/03/2019. 2. 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 3. 1.7 cm right ovarian corpus luteal cyst. 4. No other acute maternal uterine or adnexal abnormality identified. Electronically  Signed   By: Rise Mu M.D.   On: 11/16/2018 21:04     MAU Management/MDM: Orders Placed This Encounter  Procedures  . Wet prep, genital  . US OB Comp Less 14 Wks  . Urinalysis, Routine w reflex microscopic  . CBC  . hCG, quantitative, pregnancy  . HIV Antibody  (routine testing w rflx)  . Urinalysis, Microscopic (reflex)  . ABO/Rh  . Discharge patient    Meds ordered this encounter  Medications  . FOLLOWED BY Linked Order Group   . lactated ringers bolus 1,000 mL   . diphenhydrAMINE (BENADRYL) injection 25 mg   . metoCLOPramide (REGLAN) injection 10 mg   . dexamethasone (DECADRON) injection 10 mg  . famotidine (PEPCID) IVPB 20 mg premix  . promethazine (PHENERGAN) injection 12.5 mg  . lactated ringers bolus 1,000 mL  . pantoprazole (PROTONIX) 40 MG tablet    Sig: Take 1 tablet (40 mg total) by mouth daily.    Dispense:  30 tablet    Refill:  1    Order Specific Question:   Supervising Provider    Answer:   Reva Bores [[id number]]  . DISCONTD: promethazine (PHENERGAN) 25 MG tablet    Sig: Take 0.5-1 tablets (12.5-25 mg total) by mouth every 6 (six) hours as needed for nausea.    Dispense:  30 tablet    Refill:  5    Order Specific Question:   Supervising Provider    Answer:   Reva Bores [[id number]]  . promethazine (PHENERGAN) 25 MG tablet    Sig: Take 0.5-1 tablets (12.5-25 mg total) by mouth every 6 (six) hours as needed for nausea.    Dispense:  30 tablet    Refill:  5    Order Specific Question:   Supervising Provider    Answer:   Reva Bores [[id number]]    Pt with burning chest pain c/w GERD, and pain to palpation of both breasts. Evaluation less than 24 hours ago in ED and during prior admission with normal EKG, no cardiac findings. Breast exam benign other than tenderness, likely related to pregnancy.  IV fluids, antiemetics, headache cocktail given with improved symptoms. Epigastric pain improved but not resolved by IV Pepcid.  D/C home with plan for pt to f/u with OB/Gyn provider of her choice, pt may terminate pregnancy and has appointment scheduled.  Rx written for Phenergan, pt encouraged to use [facility] pharmacy to save costs.  Rx for Protonix also, pt may save cost with GoodRx coupon.  List of OB/Gyn providers given.  Return to ED  for worsening chest pain, to MAU for pregnancy related symptoms.  Pt discharged with strict return precautions.  ASSESSMENT 1. Nausea and vomiting during pregnancy prior to [redacted] weeks gestation   2. Vaginal bleeding in pregnancy, first trimester   3. GERD with esophagitis   4. Breast pain during pregnancy   5. Normal IUP (intrauterine pregnancy) on prenatal ultrasound, first trimester     PLAN Discharge home Allergies as of 11/16/2018   No Known Allergies     Medication List    STOP taking these medications   calcium carbonate 500 MG chewable tablet Commonly known as:  Tums   doxylamine (Sleep) 25 MG tablet Commonly known as:  UNISOM   Doxylamine-Pyridoxine 10-10 MG Tbec   famotidine 20 MG tablet Commonly known as:  PEPCID   ondansetron 4 MG disintegrating tablet Commonly known as:  Zofran ODT   pyridOXINE 25 MG tablet Commonly known as:  VITAMIN B-6     TAKE these medications   acetaminophen 325 MG tablet Commonly known as:  TYLENOL Take 650 mg by mouth every 6 (six) hours as needed for mild pain or headache.   nitrofurantoin (macrocrystal-monohydrate) 100 MG capsule Commonly known as:  MACROBID Take 1 capsule (100 mg total) by mouth 2 (two) times daily.   pantoprazole 40 MG tablet Commonly known as:  PROTONIX Take 1 tablet (40 mg total) by mouth daily.   promethazine 25 MG tablet Commonly known as:  PHENERGAN Take 0.5-1 tablets (12.5-25 mg total) by mouth every 6 (six) hours as needed for nausea.      Follow-up Information    [facility] Follow up.   Specialty:  Obstetrics and Gynecology Why:  As desired for prenatal care.  Return to MAU as needed for emergencies.  Contact information: [address], [address] [id number] [place] [address] [phone]          Sharen Counter Certified Nurse-Midwife 11/16/2018  10:44 PM

## 2018-11-17 LAB — ABO/RH: ABO/RH(D): O POS

## 2018-11-17 LAB — HIV ANTIBODY (ROUTINE TESTING W REFLEX): HIV Screen 4th Generation wRfx: NONREACTIVE

## 2018-11-17 LAB — GC/CHLAMYDIA PROBE AMP (~~LOC~~) NOT AT ARMC
Chlamydia: NEGATIVE
Neisseria Gonorrhea: NEGATIVE

## 2020-08-13 IMAGING — DX CHEST  1 VIEW
1 series · 1 of 1 positions shown · non-contrast
Comparison: Chest x-ray dated December 31, 2017.

CLINICAL DATA: Left-sided chest pain.

EXAM:
CHEST  1 VIEW

[chest ap]
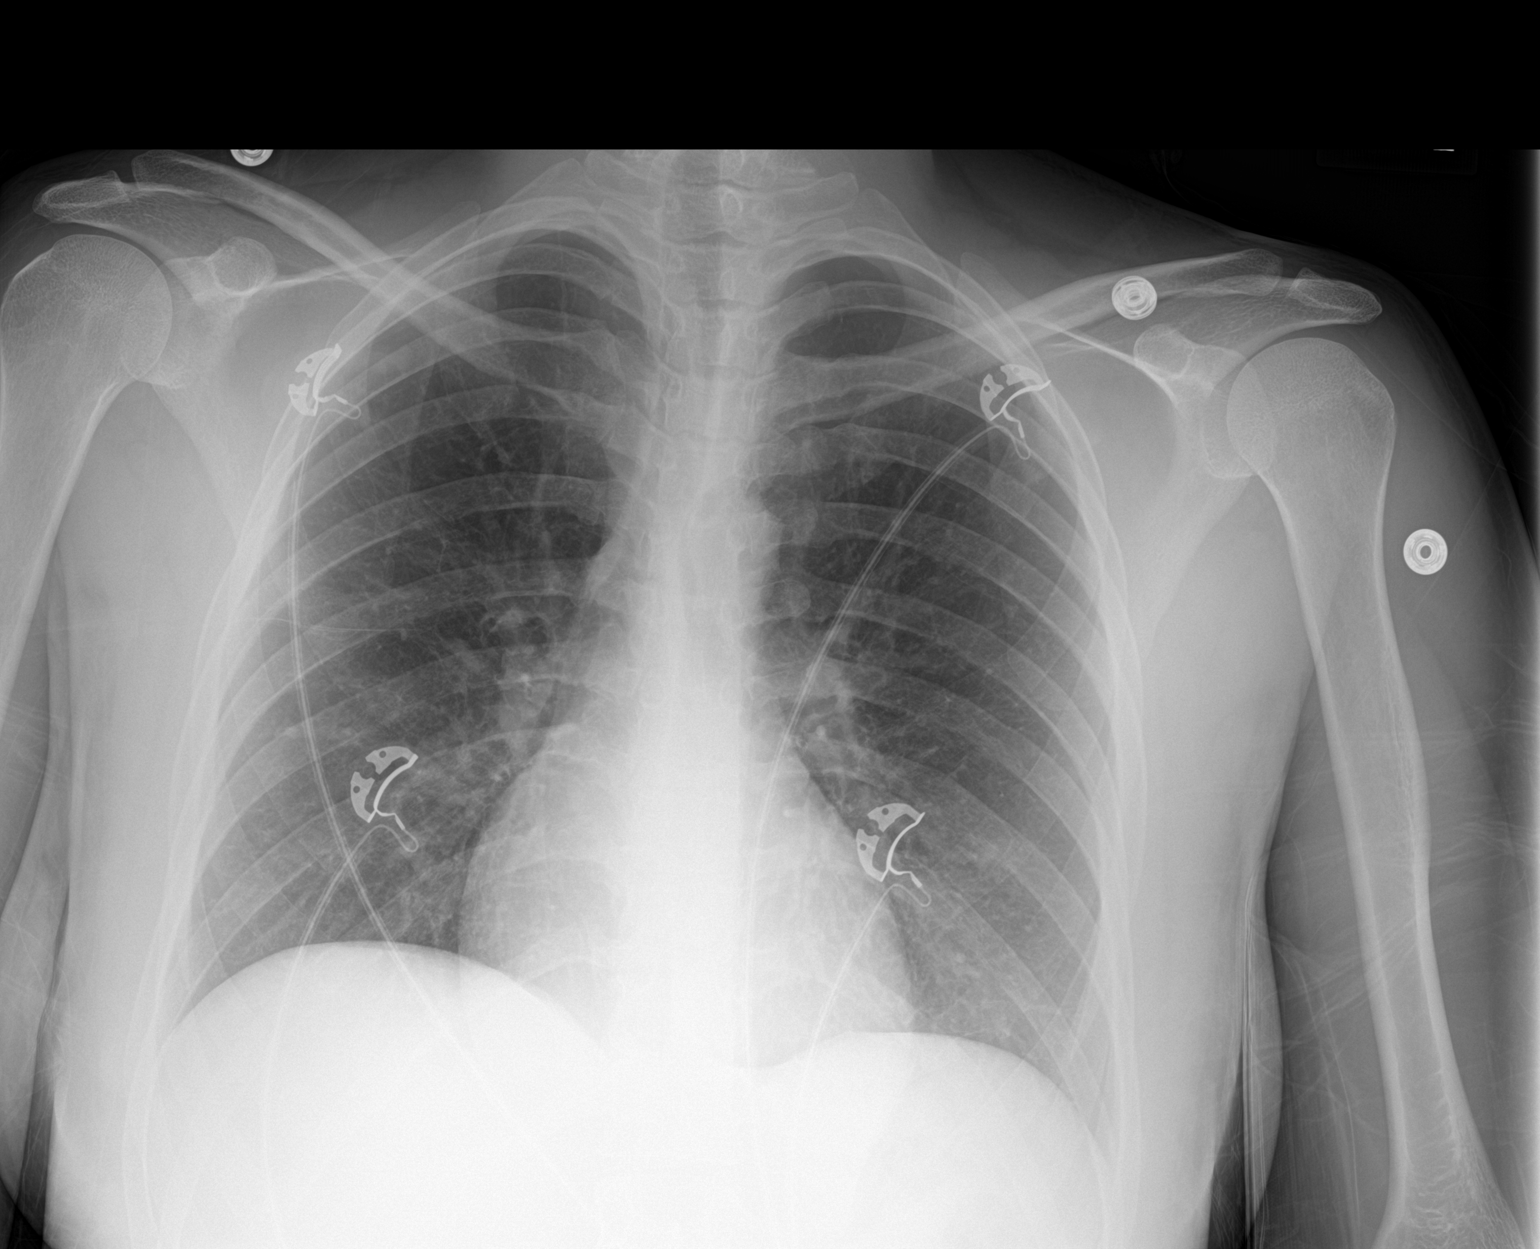

[1 of 1 positions shown; findings below may reference images not displayed]

FINDINGS: The heart size and mediastinal contours are within normal limits.
Both lungs are clear. The visualized skeletal structures are
unremarkable.
IMPRESSION: No active disease.

## 2020-08-13 IMAGING — US ULTRASOUND ABDOMEN LIMITED
1 series · 14 of 25 positions shown · non-contrast
Comparison: None.

CLINICAL DATA: Right upper quadrant pain, nausea, vomiting and
diarrhea for approximately 2 days.

EXAM:
ULTRASOUND ABDOMEN LIMITED RIGHT UPPER QUADRANT

[Series 1: ultrasound abdomen limited · 14 of 42 slices shown]
[im 1/42]
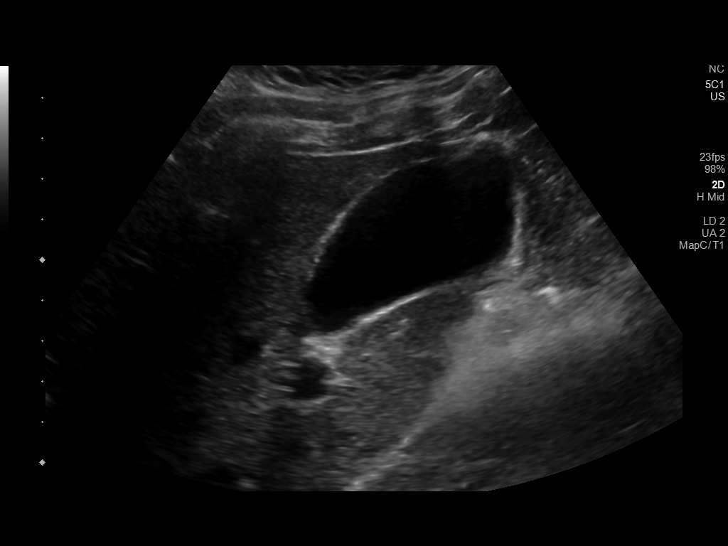
[im 4/42]
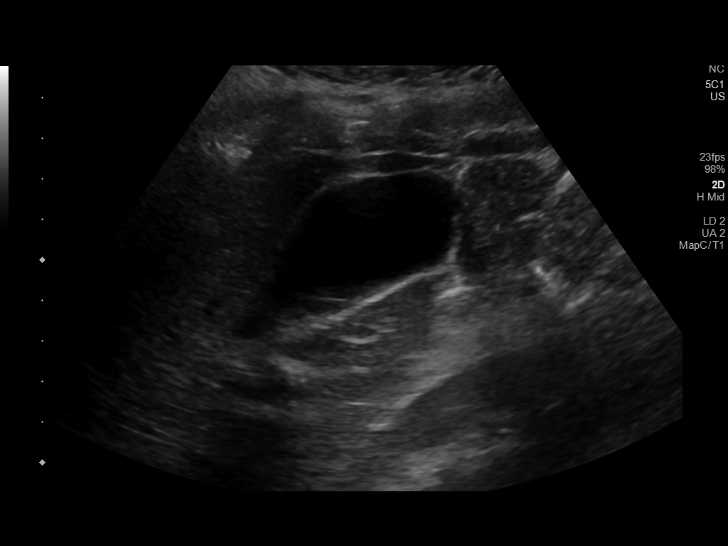
[im 7/42]
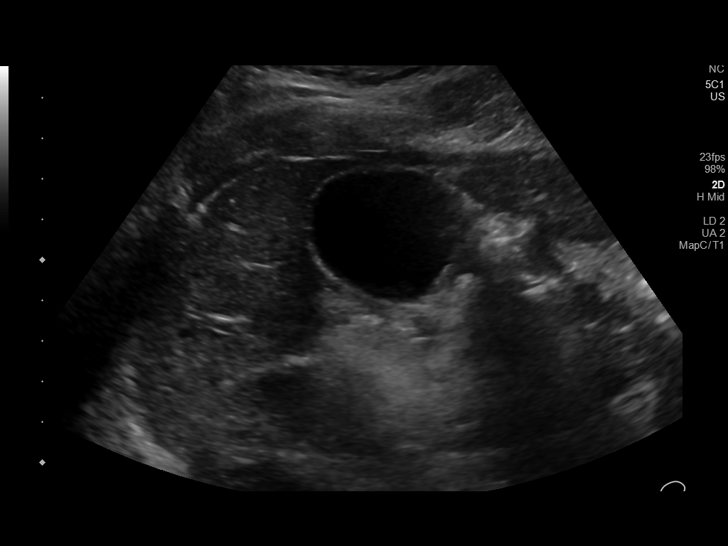
[im 11/42]
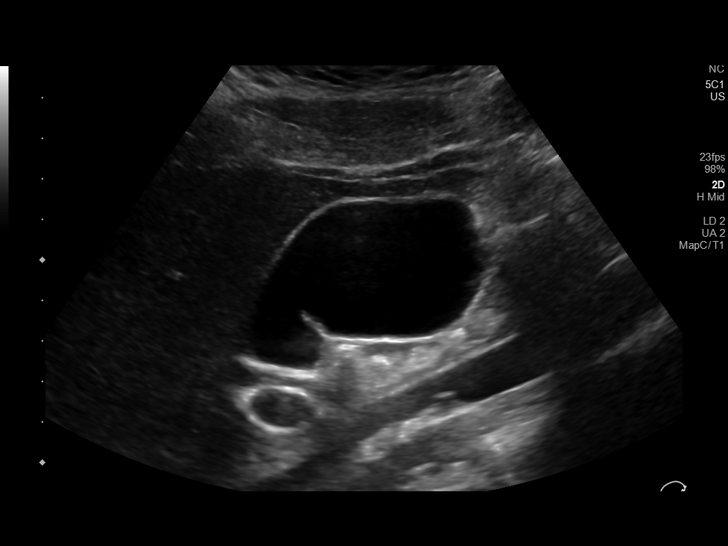
[im 14/42]
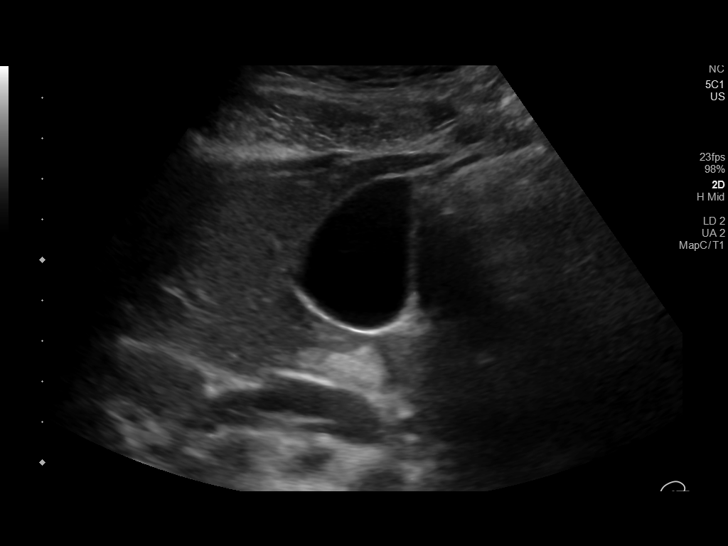
[im 16/42]
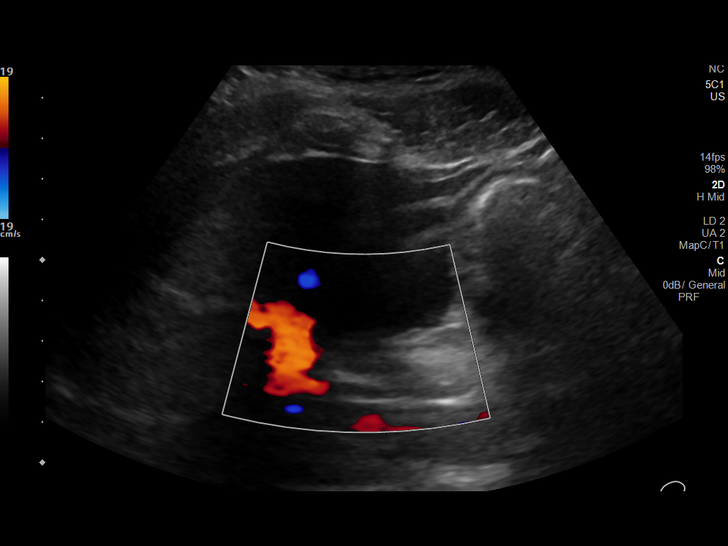
[im 19/42]
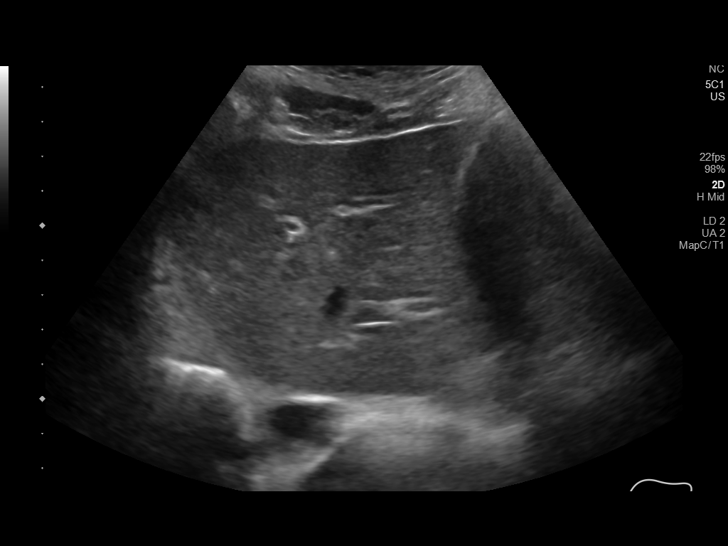
[im 23/42]
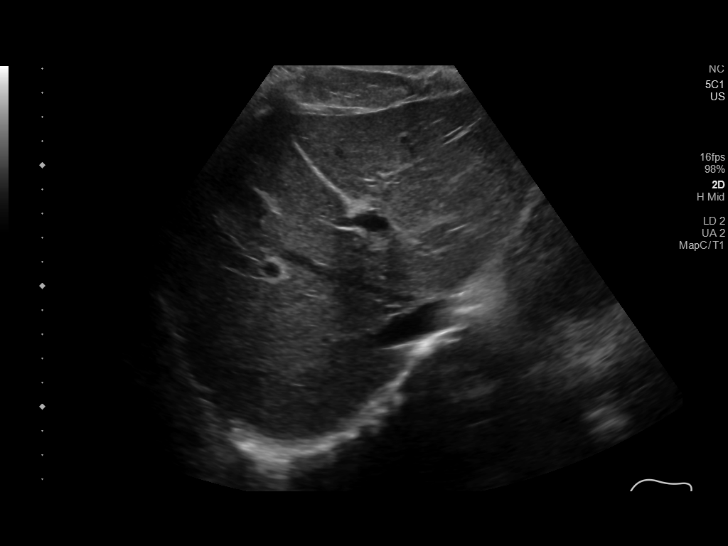
[im 26/42]
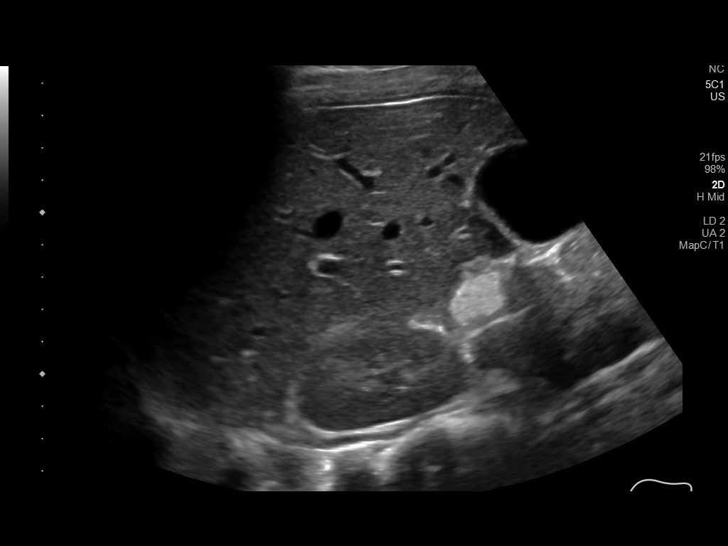
[im 28/42]
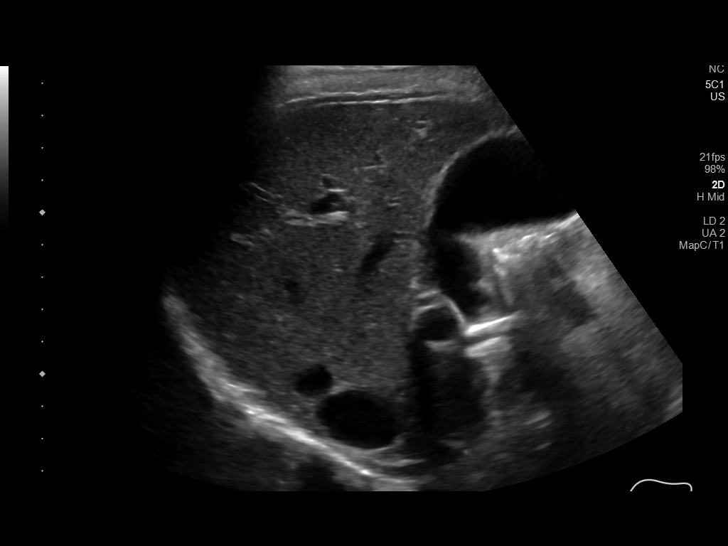
[im 31/42]
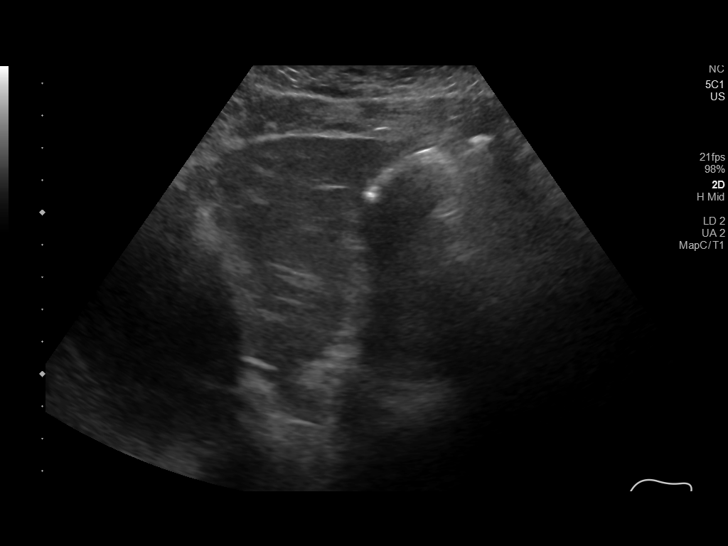
[im 35/42]
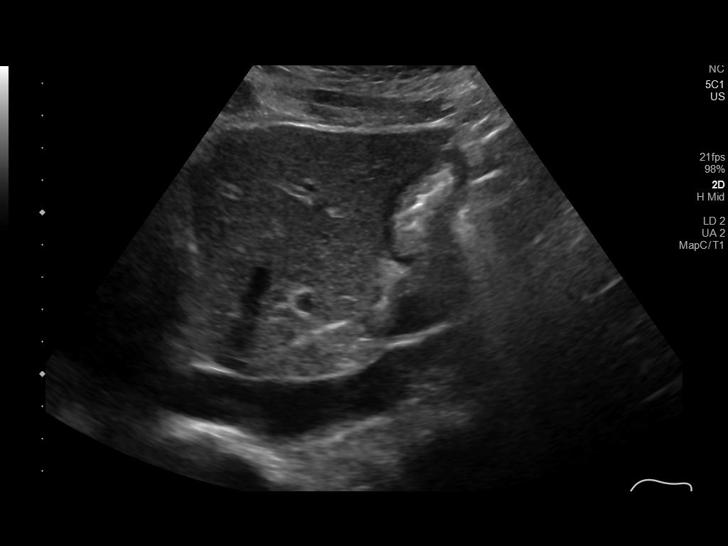
[im 38/42]
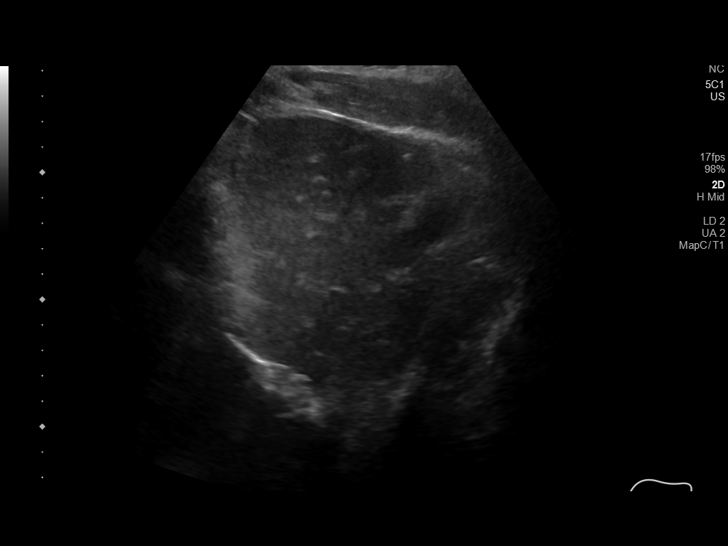
[im 42/42]
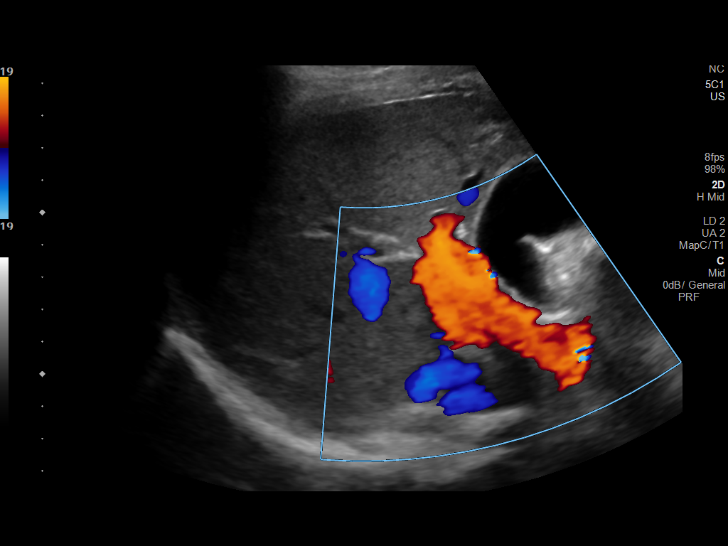

[14 of 25 positions shown; findings below may reference images not displayed]

FINDINGS: Gallbladder:

No gallstones or wall thickening visualized. No sonographic Murphy
sign noted by sonographer.

Common bile duct:

Diameter: 0.4 cm

Liver:

No focal lesion identified. Within normal limits in parenchymal
echogenicity. Portal vein is patent on color Doppler imaging with
normal direction of blood flow towards the liver.
IMPRESSION: Normal exam.  Negative for gallstones.

## 2020-08-13 IMAGING — US OBSTETRIC <14 WK ULTRASOUND
1 series · 14 of 28 positions shown · non-contrast
Comparison: None.

CLINICAL DATA: Right upper quadrant pain. Patient with quantitative
beta HCG greater than 6222. Evaluate for ectopic pregnancy.

EXAM:
OBSTETRIC <14 WK ULTRASOUND
TECHNIQUE: Transabdominal ultrasound was performed for evaluation of the
gestation as well as the maternal uterus and adnexal regions.
Patient declined transvaginal imaging.

[Series 1: obstetric <14 wk ultrasound · 14 of 61 slices shown]
[im 3/61]
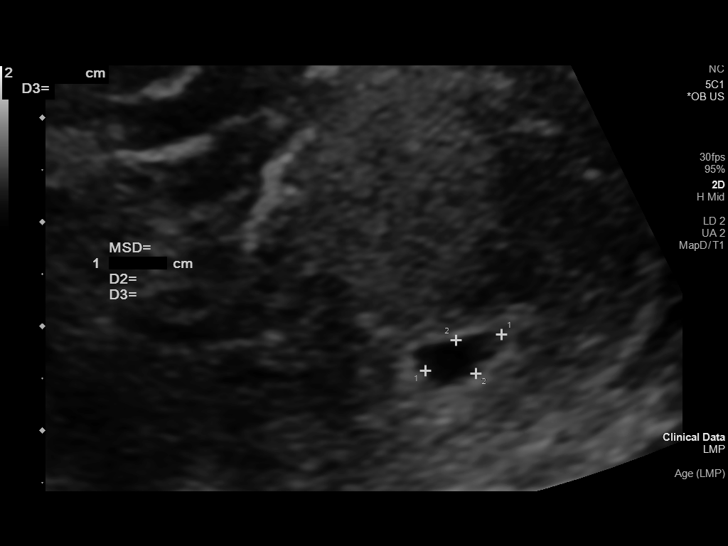
[im 7/61]
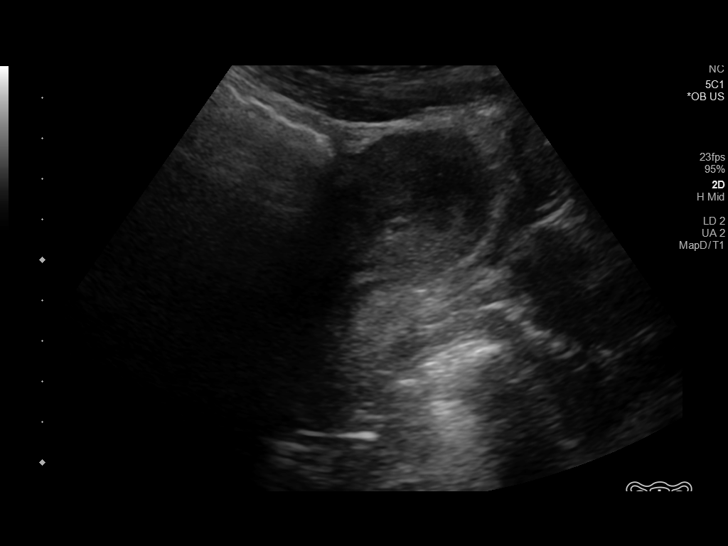
[im 12/61]
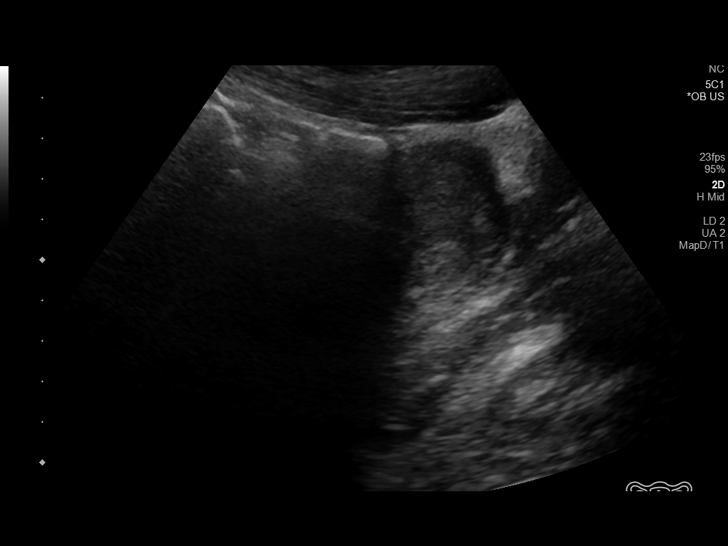
[im 16/61]
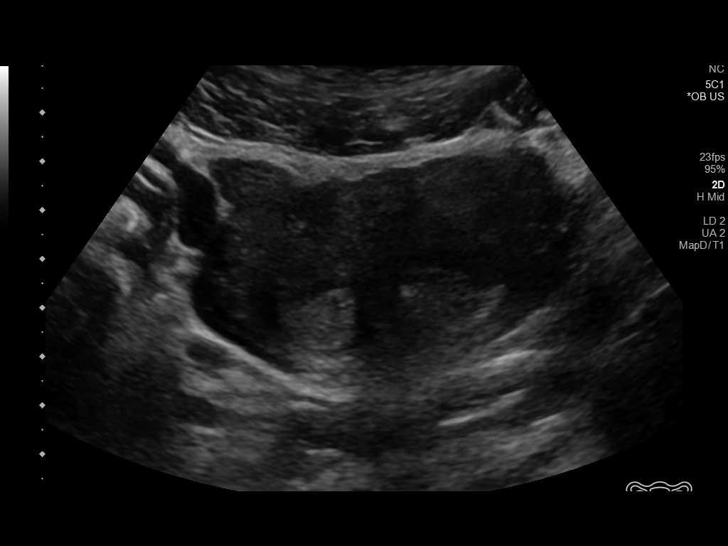
[im 21/61]
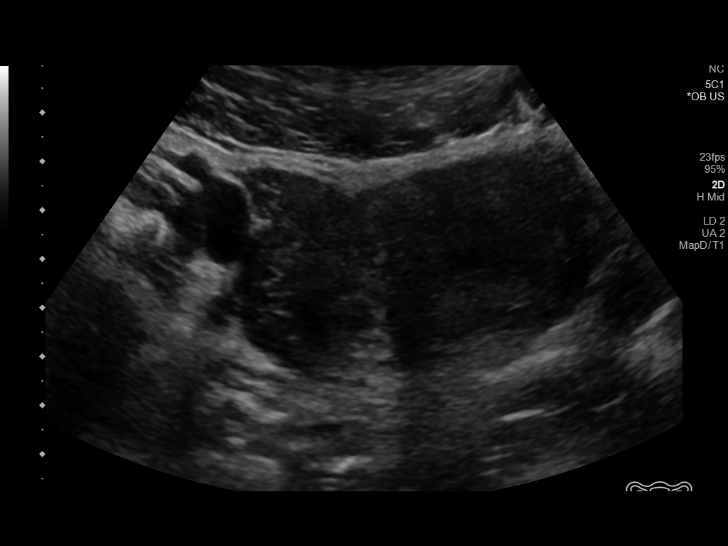
[im 25/61]
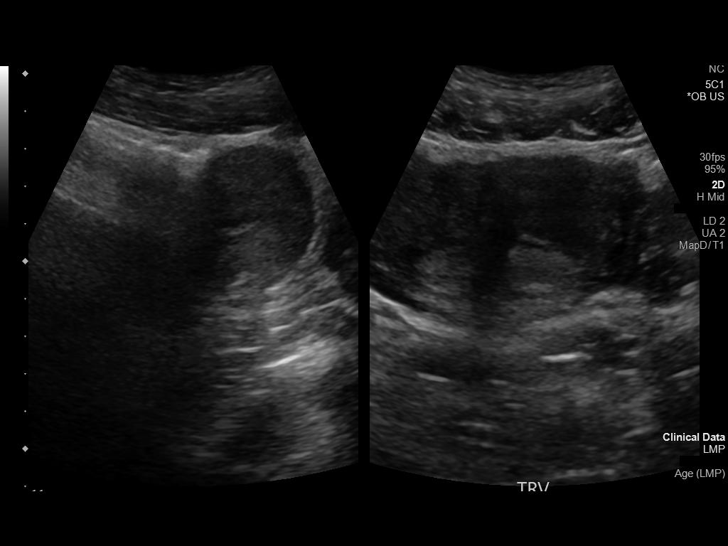
[im 29/61]
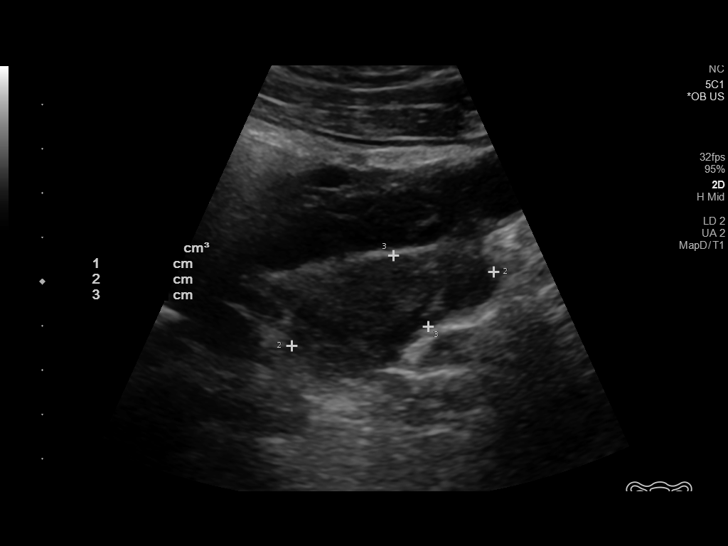
[im 34/61]
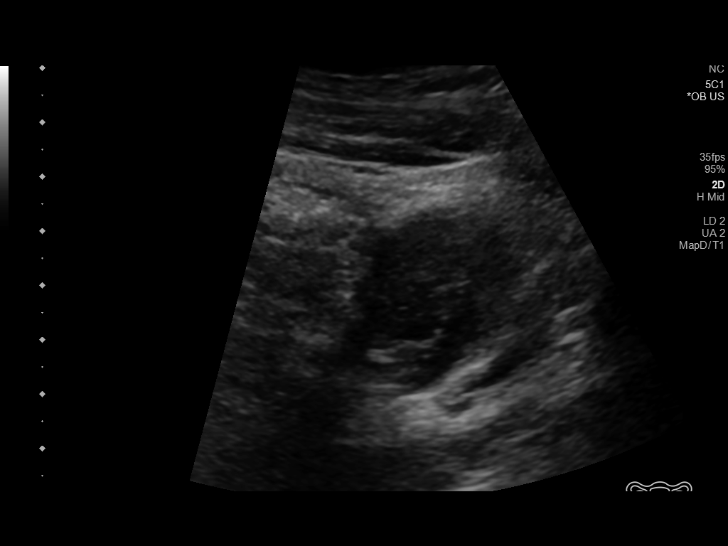
[im 38/61]
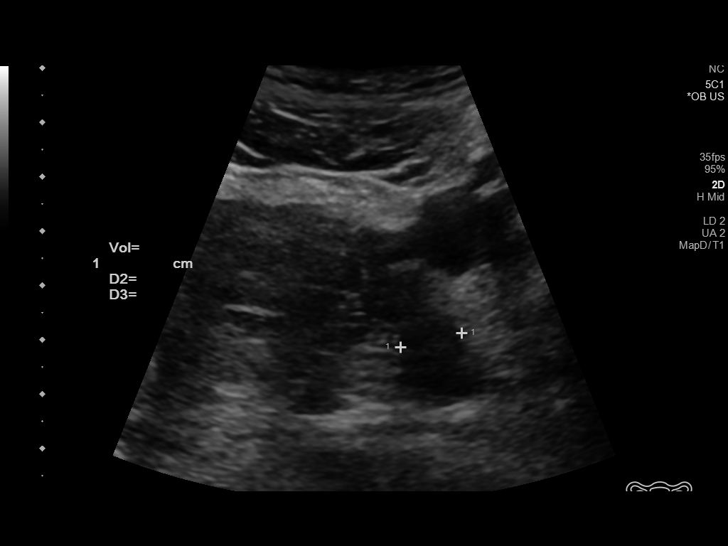
[im 43/61]
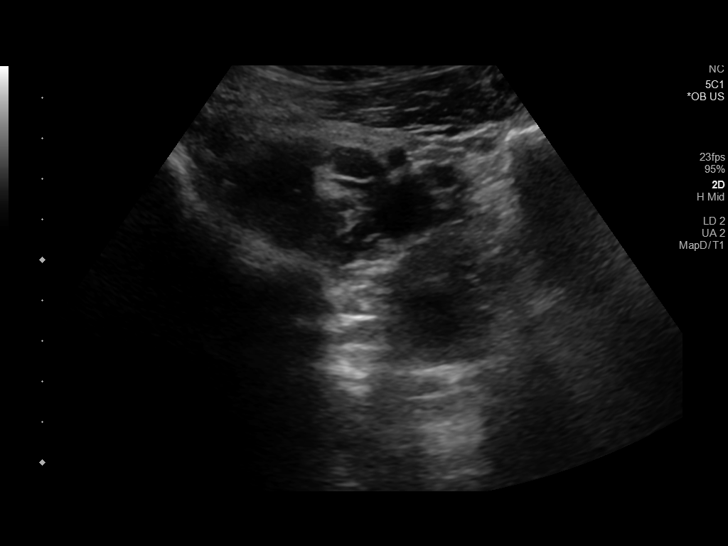
[im 47/61]
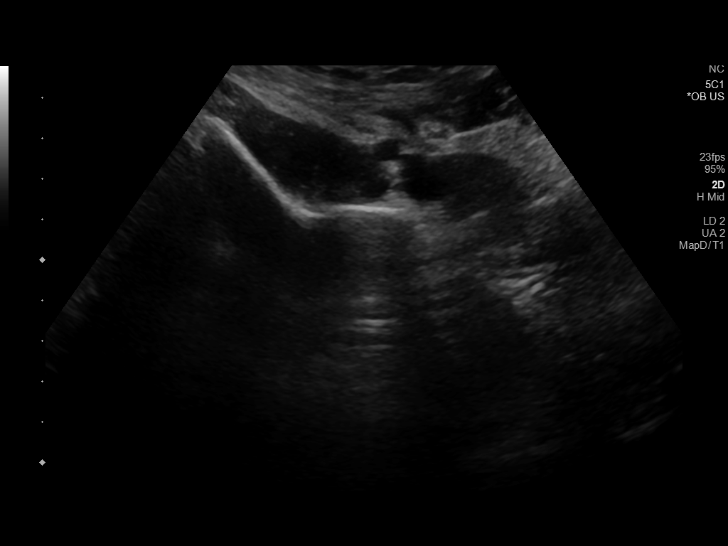
[im 52/61]
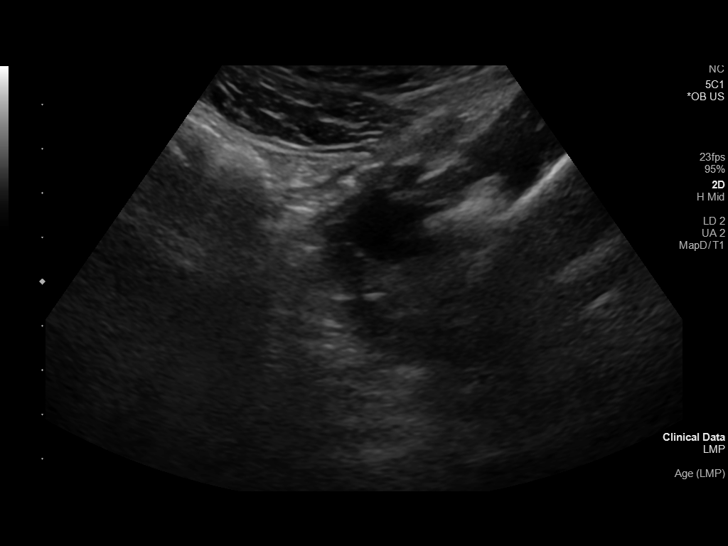
[im 56/61]
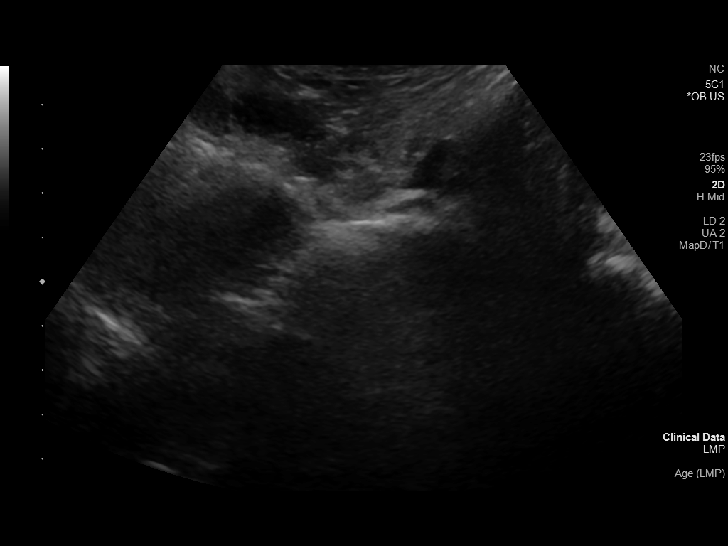
[im 61/61]
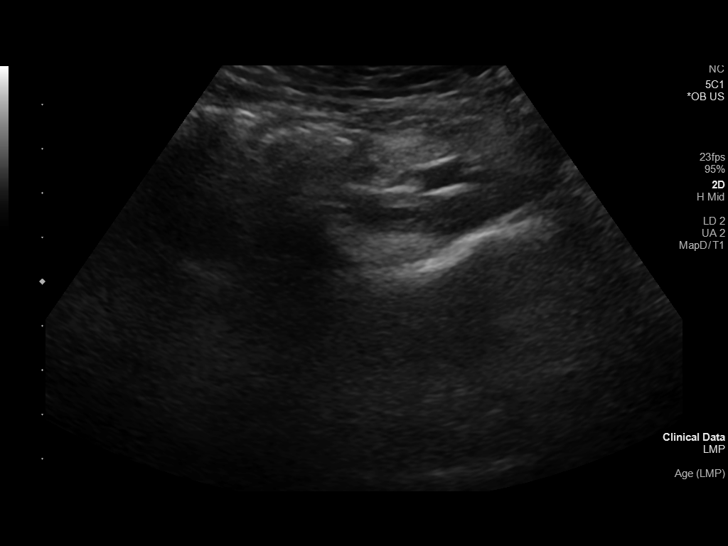

[14 of 28 positions shown; findings below may reference images not displayed]

FINDINGS: Intrauterine gestational sac: Single visualized.

Yolk sac:  Not visualized.

Embryo:  Not visualized.

Cardiac Activity: Not visualized.

Heart Rate: Not visualized.  Bpm

MSD:  6.8 mm   5 w   3 d

Subchorionic hemorrhage:  None visualized.

Maternal uterus/adnexae: Suggestion of split versus 2 separate
endometrium which may be due to didelphys versus arcuate uterus.
Ovaries normal size, shape and position with normal color Doppler.
IMPRESSION: Intrauterine gestational sac without yolk sac or embryo. Mean sac
diameter compatible with estimated gestational age 5 weeks 3 days.
Findings likely represent a normal early pregnancy and less likely
anembryonic pregnancy. Recommend correlation with serial
quantitative beta HCG and follow-up ultrasound 2 weeks.
